# Patient Record
Sex: Male | Born: 1990
Health system: Southern US, Community
[De-identification: ages and names within clinical notes are randomized; demographics above are authoritative.]

## PROBLEM LIST (undated history)

## (undated) DIAGNOSIS — I10 Essential (primary) hypertension: Secondary | ICD-10-CM

## (undated) DIAGNOSIS — E079 Disorder of thyroid, unspecified: Secondary | ICD-10-CM

## (undated) HISTORY — DX: Disorder of thyroid, unspecified: E07.9

## (undated) HISTORY — DX: Essential (primary) hypertension: I10

---

## 2016-11-04 ENCOUNTER — Ambulatory Visit (HOSPITAL_COMMUNITY)
Admission: RE | Admit: 2016-11-04 | Discharge: 2016-11-04 | Disposition: A | Discharge status: Home / Self Care | Payer: BLUE CROSS/BLUE SHIELD | Source: Ambulatory Visit | Attending: Vascular Surgery | Admitting: Vascular Surgery

## 2016-11-04 ENCOUNTER — Other Ambulatory Visit: Payer: Self-pay | Admitting: Cardiology

## 2016-11-04 DIAGNOSIS — I10 Essential (primary) hypertension: Secondary | ICD-10-CM

## 2017-01-12 ENCOUNTER — Other Ambulatory Visit (HOSPITAL_COMMUNITY): Payer: Self-pay | Admitting: Gastroenterology

## 2017-01-12 DIAGNOSIS — R7989 Other specified abnormal findings of blood chemistry: Secondary | ICD-10-CM

## 2017-01-12 DIAGNOSIS — R945 Abnormal results of liver function studies: Secondary | ICD-10-CM

## 2017-01-19 ENCOUNTER — Ambulatory Visit (HOSPITAL_COMMUNITY)
Admission: RE | Admit: 2017-01-19 | Discharge: 2017-01-19 | Disposition: A | Discharge status: Home / Self Care | Payer: BLUE CROSS/BLUE SHIELD | Source: Ambulatory Visit | Attending: Gastroenterology | Admitting: Gastroenterology

## 2017-01-19 DIAGNOSIS — R945 Abnormal results of liver function studies: Secondary | ICD-10-CM | POA: Diagnosis present

## 2017-01-19 DIAGNOSIS — K760 Fatty (change of) liver, not elsewhere classified: Secondary | ICD-10-CM | POA: Insufficient documentation

## 2017-01-19 DIAGNOSIS — R7989 Other specified abnormal findings of blood chemistry: Secondary | ICD-10-CM

## 2017-10-01 DIAGNOSIS — I10 Essential (primary) hypertension: Secondary | ICD-10-CM | POA: Diagnosis not present

## 2017-10-01 DIAGNOSIS — E039 Hypothyroidism, unspecified: Secondary | ICD-10-CM | POA: Diagnosis not present

## 2018-01-01 DIAGNOSIS — E039 Hypothyroidism, unspecified: Secondary | ICD-10-CM | POA: Diagnosis not present

## 2018-01-26 ENCOUNTER — Other Ambulatory Visit (HOSPITAL_COMMUNITY): Payer: Self-pay | Admitting: Gastroenterology

## 2018-01-26 DIAGNOSIS — K7400 Hepatic fibrosis, unspecified: Secondary | ICD-10-CM

## 2018-01-26 DIAGNOSIS — K760 Fatty (change of) liver, not elsewhere classified: Secondary | ICD-10-CM

## 2018-03-31 DIAGNOSIS — Z6835 Body mass index (BMI) 35.0-35.9, adult: Secondary | ICD-10-CM | POA: Diagnosis not present

## 2018-03-31 DIAGNOSIS — Z23 Encounter for immunization: Secondary | ICD-10-CM | POA: Diagnosis not present

## 2018-03-31 DIAGNOSIS — E039 Hypothyroidism, unspecified: Secondary | ICD-10-CM | POA: Diagnosis not present

## 2018-03-31 DIAGNOSIS — I10 Essential (primary) hypertension: Secondary | ICD-10-CM | POA: Diagnosis not present

## 2018-09-25 IMAGING — US US ABDOMEN COMPLETE W/ ELASTOGRAPHY
1 series · 13 of 13 positions shown · non-contrast
Comparison: None.

CLINICAL DATA: Abnormal liver function tests



[Series 1: us abdomen complete w/ elastography · 0.20mm/px · 13 of 13 slices shown]
[im 1/13]
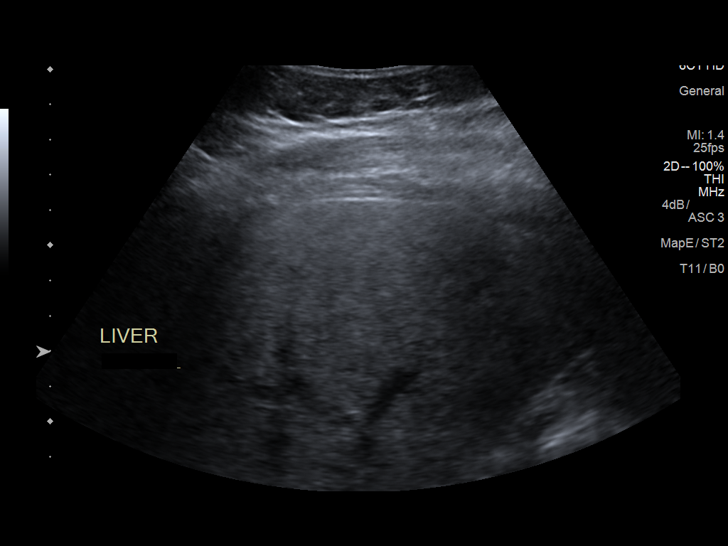
[im 2/13]
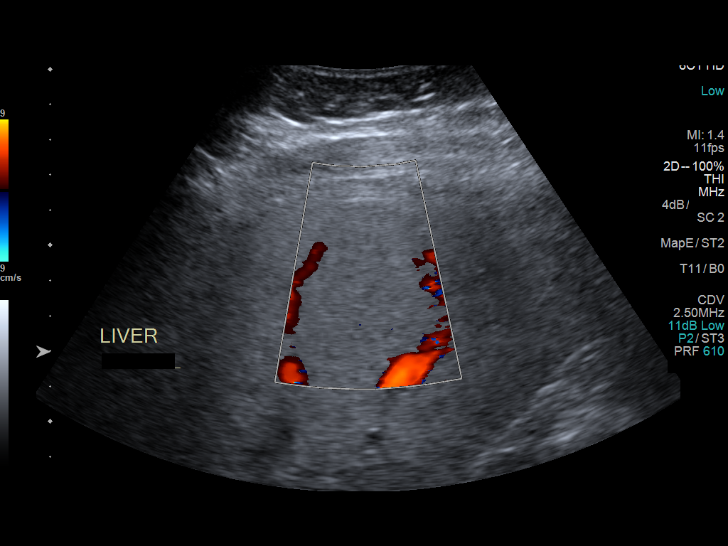
[im 3/13]
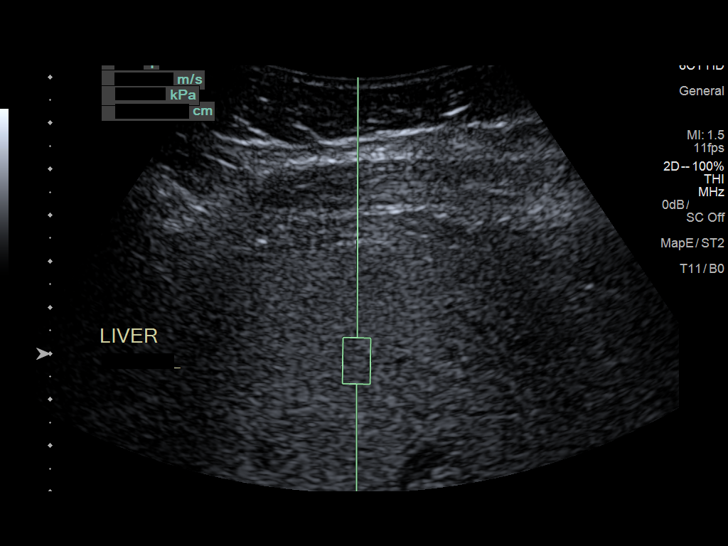
[im 4/13]
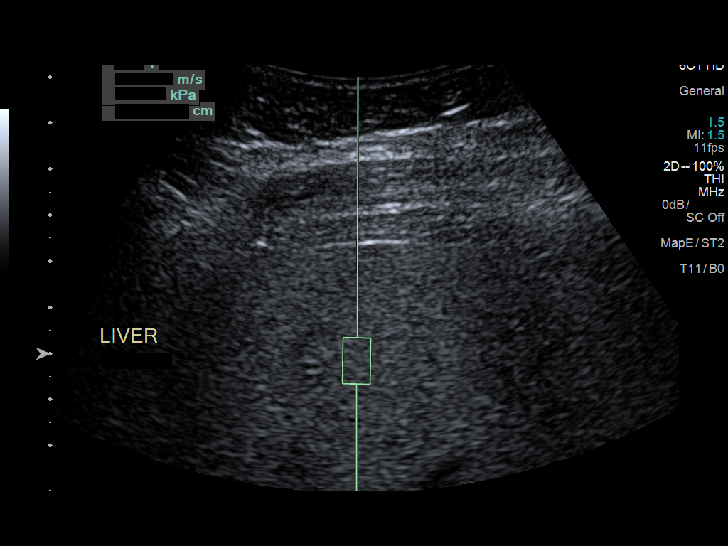
[im 5/13]
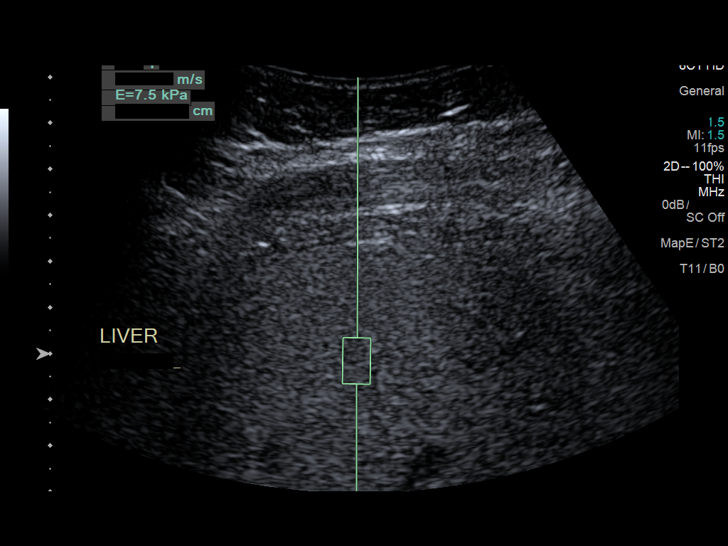
[im 6/13]
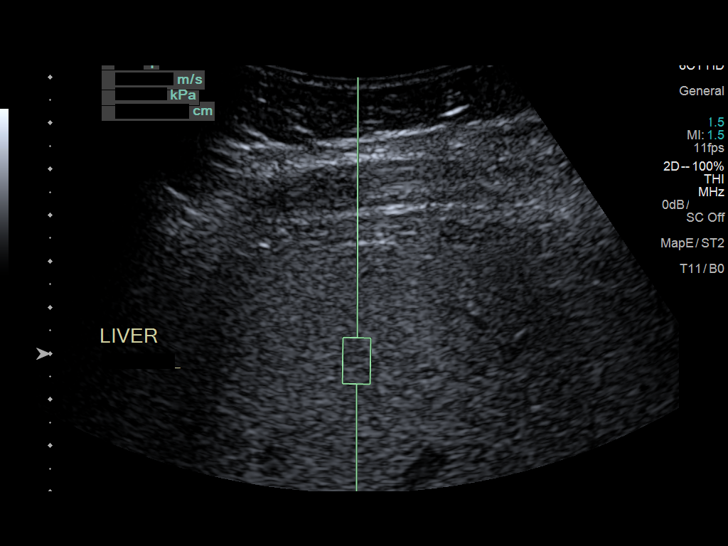
[im 7/13]
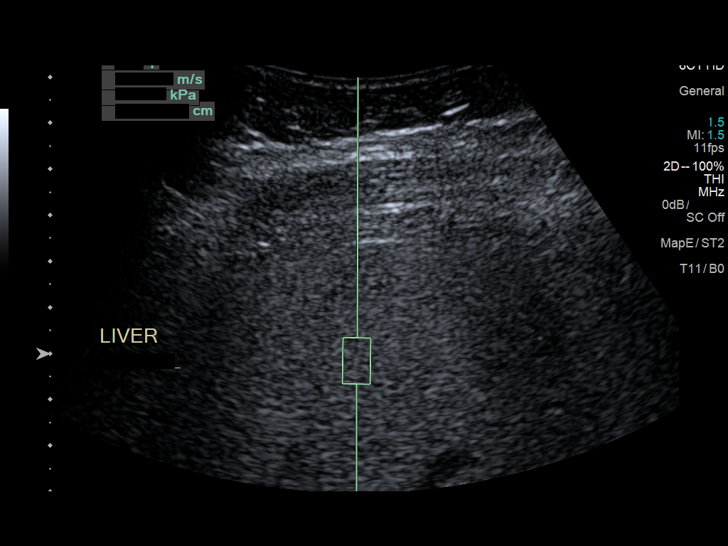
[im 8/13]
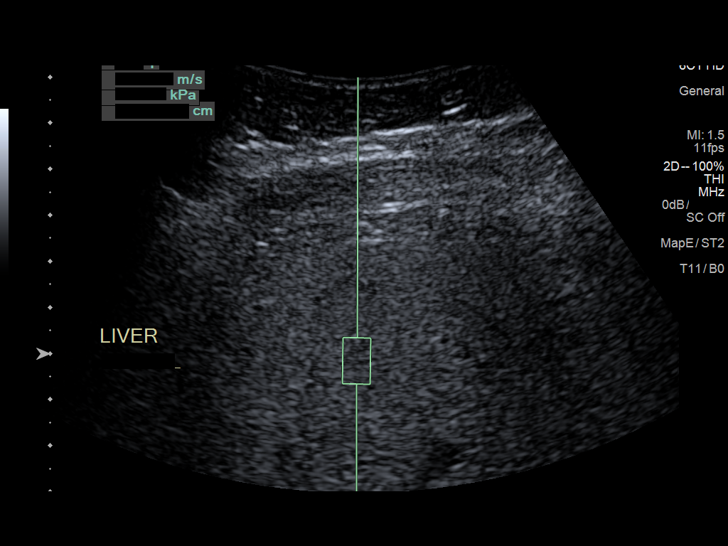
[im 9/13]
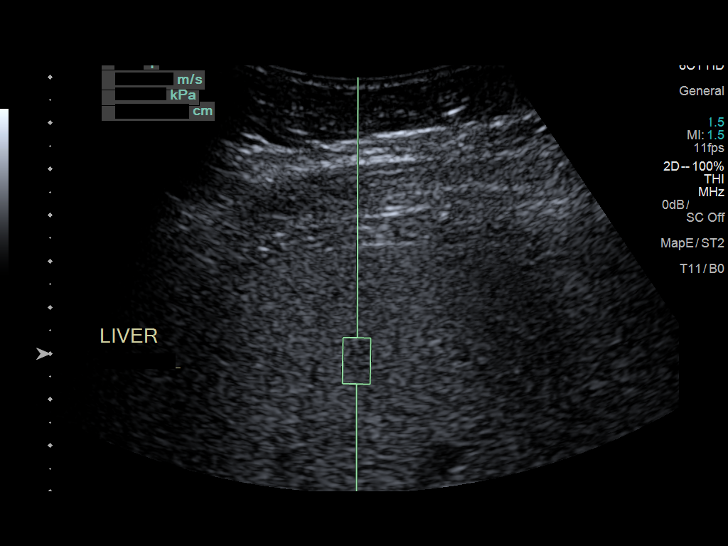
[im 10/13]
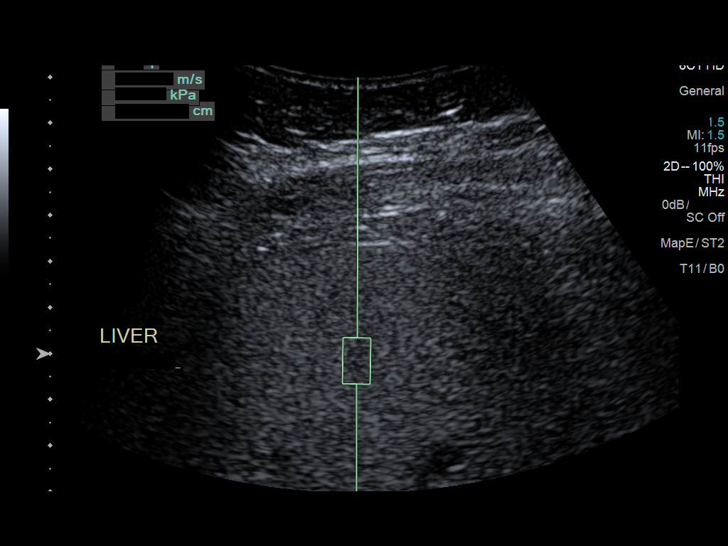
[im 11/13]
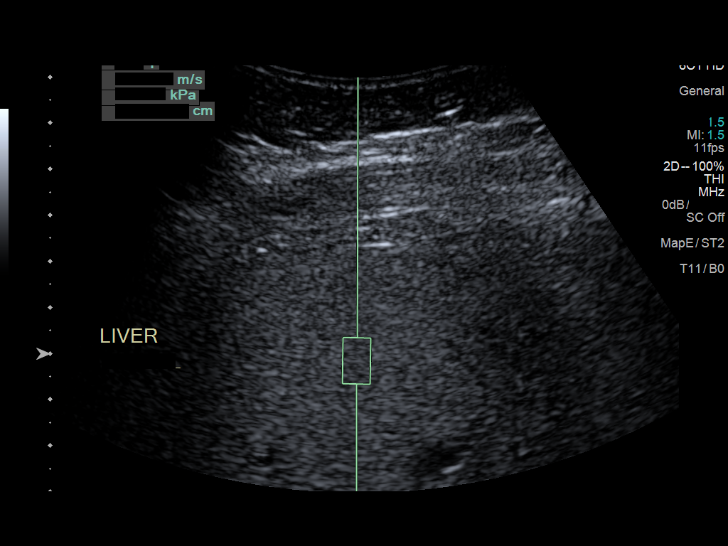
[im 12/13]
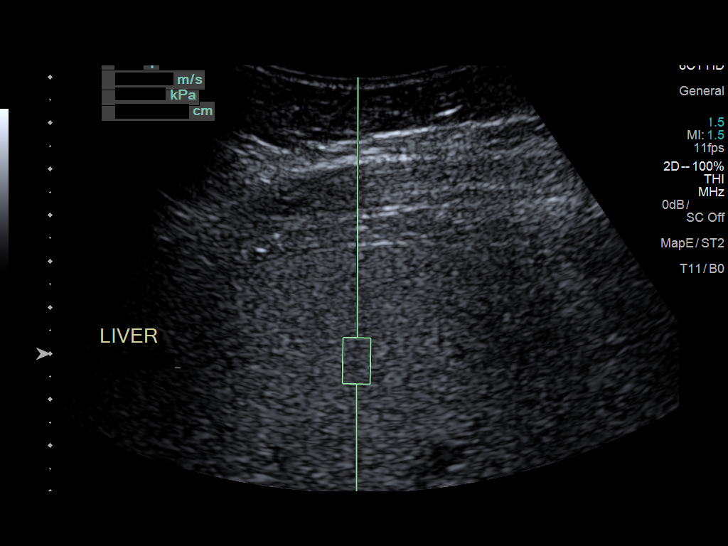
[im 13/13]
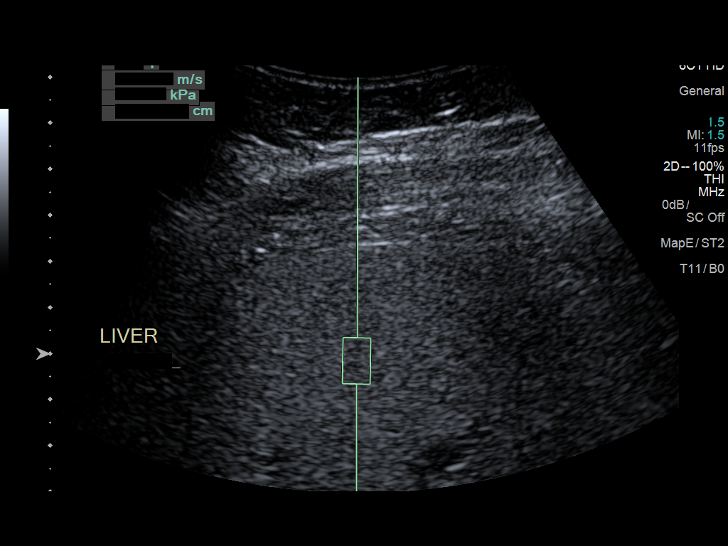

[13 of 13 positions shown; findings below may reference images not displayed]

FINDINGS: ULTRASOUND ABDOMEN

Gallbladder: No gallstones or wall thickening visualized. No
sonographic Murphy sign noted by sonographer.

Common bile duct: Diameter: 4 mm

Liver: Liver parenchyma is diffusely moderately into markedly
echogenic with prominent posterior acoustic attenuation, compatible
with diffuse hepatic steatosis. No definite liver surface
irregularity. No liver mass detected, noting decreased sensitivity
in the setting of an echogenic liver. Portal vein is patent on color
Doppler imaging with normal direction of blood flow towards the
liver.

IVC: No abnormality visualized.

Pancreas: Visualized portion unremarkable.

Spleen: Size and appearance within normal limits.

Right Kidney: Length: 13.5 cm. Echogenicity within normal limits. No
mass or hydronephrosis visualized.

Left Kidney: Length: 13.2 cm. Echogenicity within normal limits. No
mass or hydronephrosis visualized.

Abdominal aorta: No aneurysm visualized.

Other findings: None.

ULTRASOUND HEPATIC ELASTOGRAPHY

Device: Siemens Helix VTQ

Patient position: Left Lateral Decubitus

Transducer 6C1

Number of measurements: 10

Hepatic segment:  8

Median velocity:   2.57  m/sec

IQR:

IQR/Median velocity ratio:

Corresponding Metavir fibrosis score:  Some F3 + F4

Risk of fibrosis: High

Limitations of exam: None

Pertinent findings noted on other imaging exams:  None

Please note that abnormal shear wave velocities may also be
identified in clinical settings other than with hepatic fibrosis,
such as: acute hepatitis, elevated right heart and central venous
pressures including use of beta blockers, Jules disease
(Limone), infiltrative processes such as
mastocytosis/amyloidosis/infiltrative tumor, extrahepatic
cholestasis, in the post-prandial state, and liver transplantation.
Correlation with patient history, laboratory data, and clinical
condition recommended.
IMPRESSION: ULTRASOUND ABDOMEN:

1. Diffuse hepatic steatosis. No overt morphologic changes of
cirrhosis.
2. Otherwise normal abdominal sonogram.

ULTRASOUND HEPATIC ELASTOGRAPHY:

Median hepatic shear wave velocity is calculated at 2.57 m/sec.

Corresponding Metavir fibrosis score is  Some F3 + F4.

Risk of fibrosis is High.

Follow-up: Follow up advised.

## 2018-09-25 IMAGING — US US ABDOMEN COMPLETE W/ ELASTOGRAPHY
1 series · 13 of 25 positions shown · non-contrast
Comparison: None.

CLINICAL DATA: Abnormal liver function tests



[Series 1: us abdomen complete w/ elastography · 0.25mm/px · 13 of 73 slices shown]
[im 1/73]
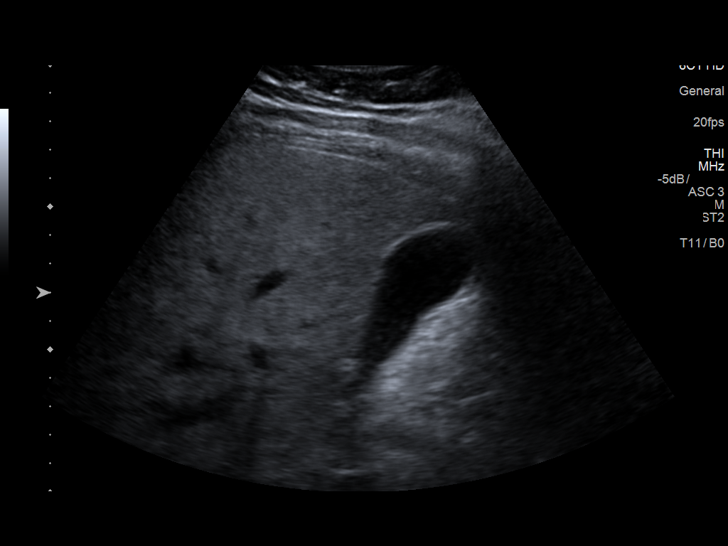
[im 7/73]
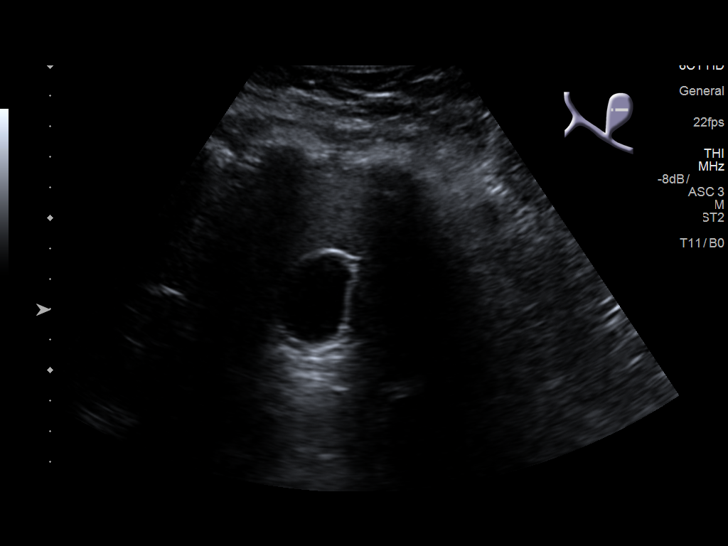
[im 13/73]
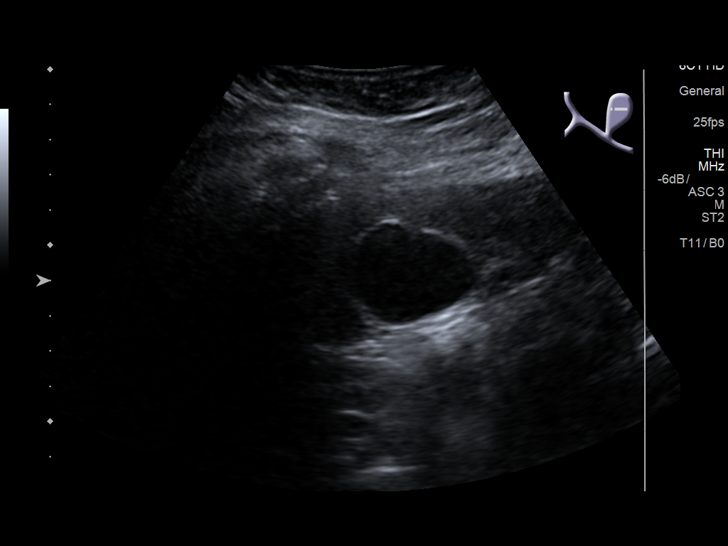
[im 19/73]
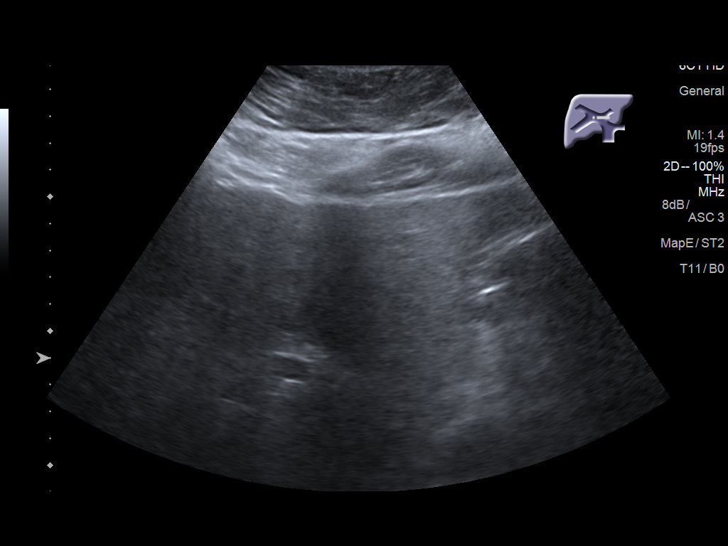
[im 25/73]
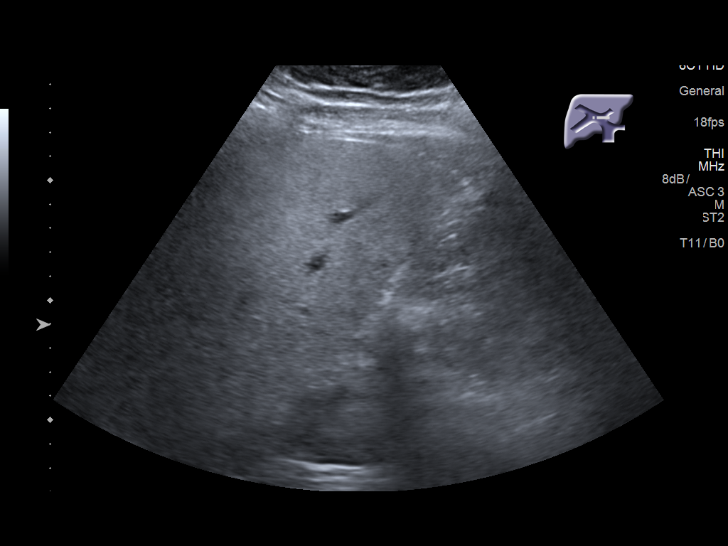
[im 31/73]
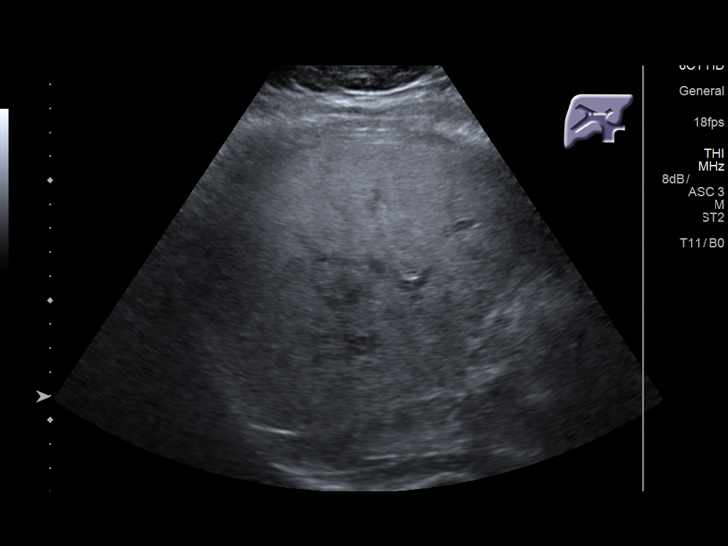
[im 37/73]
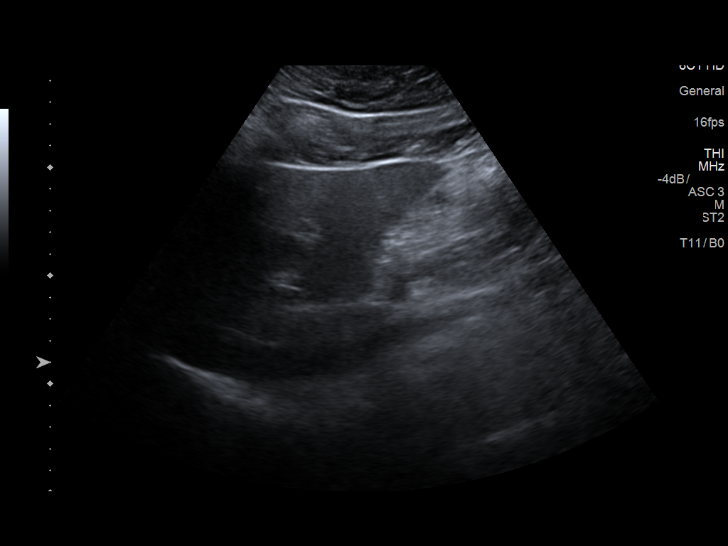
[im 43/73]
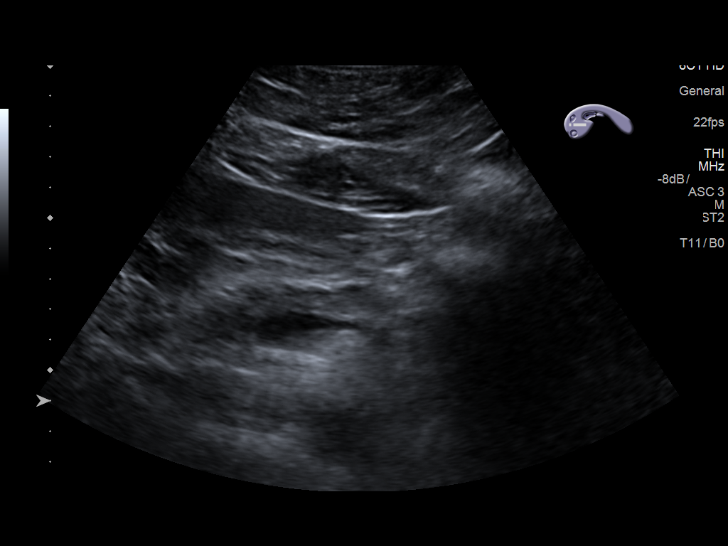
[im 49/73]
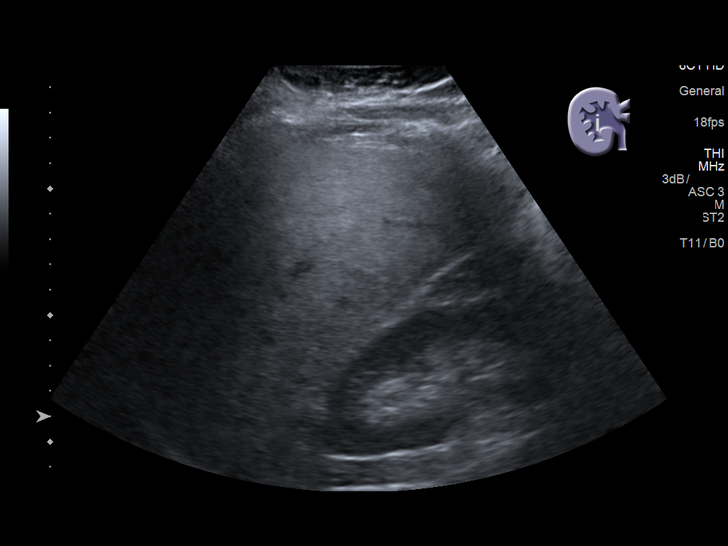
[im 55/73]
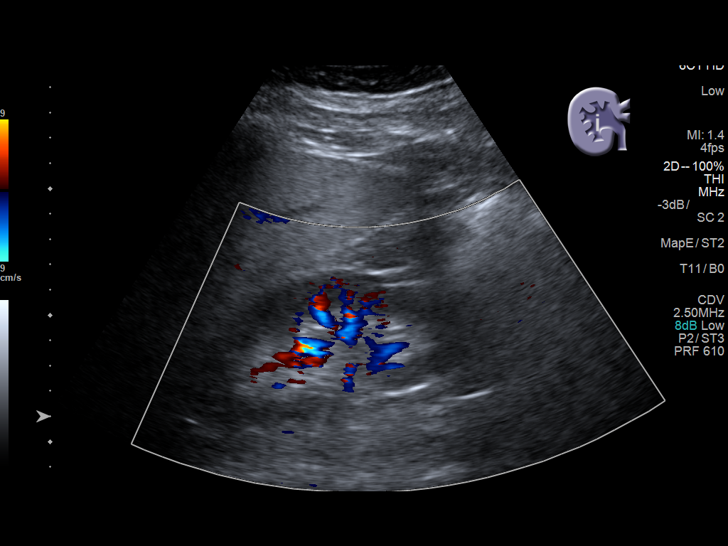
[im 61/73]
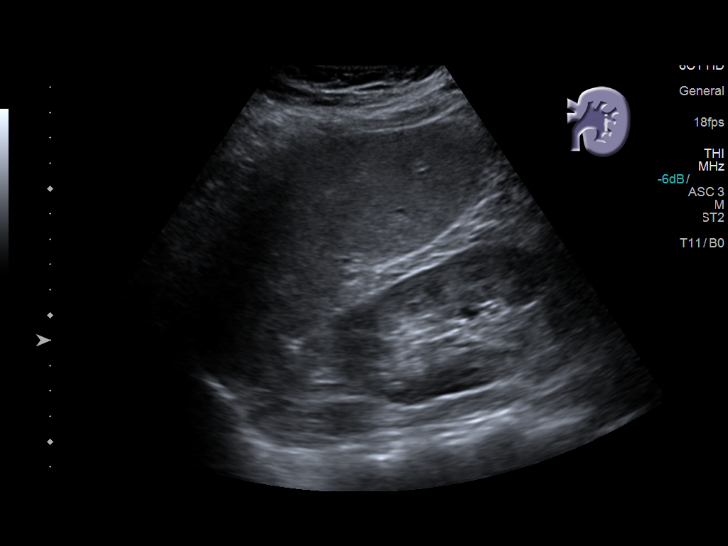
[im 67/73]
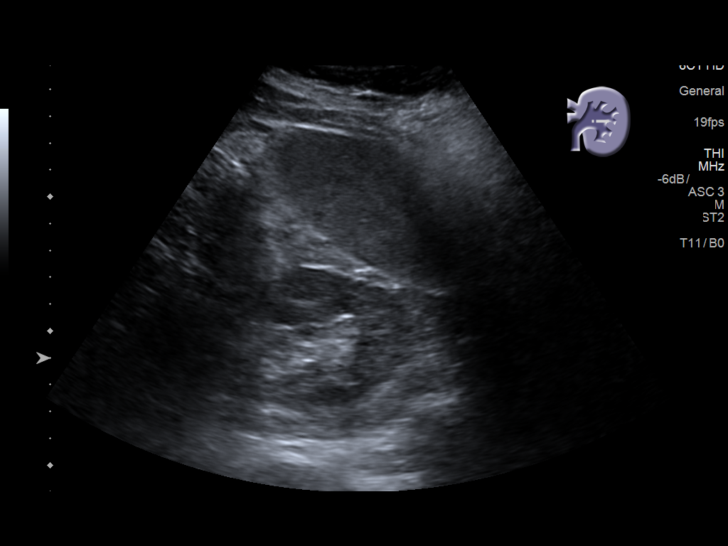
[im 73/73]
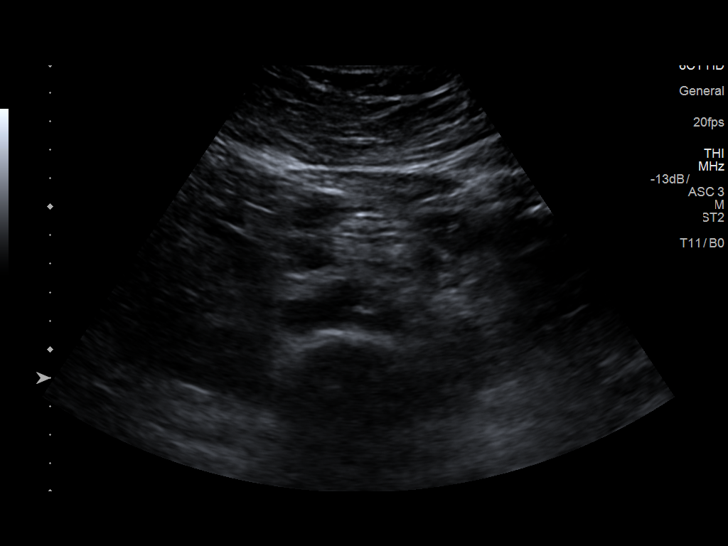

[13 of 25 positions shown; findings below may reference images not displayed]

FINDINGS: ULTRASOUND ABDOMEN

Gallbladder: No gallstones or wall thickening visualized. No
sonographic Murphy sign noted by sonographer.

Common bile duct: Diameter: 4 mm

Liver: Liver parenchyma is diffusely moderately into markedly
echogenic with prominent posterior acoustic attenuation, compatible
with diffuse hepatic steatosis. No definite liver surface
irregularity. No liver mass detected, noting decreased sensitivity
in the setting of an echogenic liver. Portal vein is patent on color
Doppler imaging with normal direction of blood flow towards the
liver.

IVC: No abnormality visualized.

Pancreas: Visualized portion unremarkable.

Spleen: Size and appearance within normal limits.

Right Kidney: Length: 13.5 cm. Echogenicity within normal limits. No
mass or hydronephrosis visualized.

Left Kidney: Length: 13.2 cm. Echogenicity within normal limits. No
mass or hydronephrosis visualized.

Abdominal aorta: No aneurysm visualized.

Other findings: None.

ULTRASOUND HEPATIC ELASTOGRAPHY

Device: Siemens Helix VTQ

Patient position: Left Lateral Decubitus

Transducer 6C1

Number of measurements: 10

Hepatic segment:  8

Median velocity:   2.57  m/sec

IQR:

IQR/Median velocity ratio:

Corresponding Metavir fibrosis score:  Some F3 + F4

Risk of fibrosis: High

Limitations of exam: None

Pertinent findings noted on other imaging exams:  None

Please note that abnormal shear wave velocities may also be
identified in clinical settings other than with hepatic fibrosis,
such as: acute hepatitis, elevated right heart and central venous
pressures including use of beta blockers, Jules disease
(Limone), infiltrative processes such as
mastocytosis/amyloidosis/infiltrative tumor, extrahepatic
cholestasis, in the post-prandial state, and liver transplantation.
Correlation with patient history, laboratory data, and clinical
condition recommended.
IMPRESSION: ULTRASOUND ABDOMEN:

1. Diffuse hepatic steatosis. No overt morphologic changes of
cirrhosis.
2. Otherwise normal abdominal sonogram.

ULTRASOUND HEPATIC ELASTOGRAPHY:

Median hepatic shear wave velocity is calculated at 2.57 m/sec.

Corresponding Metavir fibrosis score is  Some F3 + F4.

Risk of fibrosis is High.

Follow-up: Follow up advised.

## 2018-12-18 ENCOUNTER — Encounter: Payer: Self-pay | Admitting: Emergency Medicine

## 2018-12-18 ENCOUNTER — Other Ambulatory Visit: Payer: Self-pay

## 2018-12-18 ENCOUNTER — Emergency Department
Admission: EM | Admit: 2018-12-18 | Discharge: 2018-12-18 | Disposition: A | Discharge status: Home / Self Care | Payer: BLUE CROSS/BLUE SHIELD | Source: Home / Self Care

## 2018-12-18 DIAGNOSIS — T63441A Toxic effect of venom of bees, accidental (unintentional), initial encounter: Secondary | ICD-10-CM | POA: Diagnosis not present

## 2018-12-18 DIAGNOSIS — R112 Nausea with vomiting, unspecified: Secondary | ICD-10-CM

## 2018-12-18 MED ORDER — PREDNISONE 50 MG PO TABS: 50.0000 mg | ORAL_TABLET | Freq: Every day | ORAL | 0 refills | Status: AC

## 2018-12-18 MED ORDER — CETIRIZINE HCL 10 MG PO TABS: 10.0000 mg | ORAL_TABLET | Freq: Every day | ORAL | 0 refills | Status: DC

## 2018-12-18 MED ORDER — DEXAMETHASONE SODIUM PHOSPHATE 10 MG/ML IJ SOLN
10.0000 mg | Freq: Once | INTRAMUSCULAR | Status: AC
  Administered 2018-12-18: 10 mg via INTRAMUSCULAR

## 2018-12-18 MED ORDER — DIPHENHYDRAMINE HCL 50 MG/ML IJ SOLN
25.0000 mg | Freq: Once | INTRAMUSCULAR | Status: AC
  Administered 2018-12-18: 25 mg via INTRAMUSCULAR

## 2018-12-18 MED ORDER — FAMOTIDINE 40 MG PO TABS
40.0000 mg | ORAL_TABLET | Freq: Once | ORAL | Status: AC
  Administered 2018-12-18: 40 mg via ORAL

## 2018-12-18 MED ORDER — ONDANSETRON 4 MG PO TBDP: 4.0000 mg | ORAL_TABLET | Freq: Three times a day (TID) | ORAL | 0 refills | Status: DC | PRN

## 2018-12-18 MED ORDER — EPINEPHRINE 0.3 MG/0.3ML IJ SOAJ: 0.3000 mg | INTRAMUSCULAR | 1 refills | Status: DC | PRN

## 2018-12-18 NOTE — ED Provider Notes (Signed)
[facility]    CSN: [id number] Arrival date & time: 12/18/18  1242      History   Chief Complaint Chief Complaint  Patient presents with  . Insect Bite    multiple yellow jacket stings    HPI Cole Baker is a 28 y.o. male.   HPI Cole OysterKelly Baker is a 28 y.o. male presenting to UC with reports of multiple yellow jacket stings to his back, arm and abdomen about 1 hour PTA. Pt was clearing out an area of his yard at a house he recently moved into with his wife of 2 weeks.  Some of the yellow jackets flew up into his shirt. He did have 1 episode of vomiting in parking lot of urgent care. Pt denies nausea at this time, he is unsure if that was due to adrenaline from the incident that just occurred. Pt denies oral swelling or hives. Denies SOB but is concerned for possible severe allergic reaction because his father is severely allergic.  Pt has never been stung before.  Pain at location of stings is mildly burning.  No medication taken PTA.    History reviewed. No pertinent past medical history.  There are no active problems to display for this patient.   History reviewed. No pertinent surgical history.     Home Medications    Prior to Admission medications   Medication Sig Start Date End Date Taking? Authorizing Provider  levothyroxine (SYNTHROID) 150 MCG tablet Take 150 mcg by mouth daily before breakfast.   Yes [provider]  lisinopril (ZESTRIL) 20 MG tablet Take 20 mg by mouth daily.   Yes [provider]  cetirizine (ZYRTEC) 10 MG tablet Take 1 tablet (10 mg total) by mouth daily. 12/18/18   Lurene ShadowPhelps, Coby Antrobus O, PA-C  EPINEPHrine (EPIPEN 2-PAK) 0.3 mg/0.3 mL IJ SOAJ injection Inject 0.3 mLs (0.3 mg total) into the muscle as needed for anaphylaxis. 12/18/18   Lurene ShadowPhelps, Atiyana Welte O, PA-C  ondansetron (ZOFRAN ODT) 4 MG disintegrating tablet Take 1 tablet (4 mg total) by mouth every 8 (eight) hours as needed. 12/18/18   Lurene ShadowPhelps, Darshawn Boateng O, PA-C  predniSONE (DELTASONE) 50 MG  tablet Take 1 tablet (50 mg total) by mouth daily with breakfast for 5 days. 12/18/18 12/23/18  Lurene ShadowPhelps, Thayden Lemire O, PA-C    Family History No family history on file.  Social History Social History   Tobacco Use  . Smoking status: Never Smoker  . Smokeless tobacco: Never Used  Substance Use Topics  . Alcohol use: Not on file  . Drug use: Not on file     Allergies   Cefzil [cefprozil]   Review of Systems Review of Systems  Constitutional: Negative for chills and fever.  HENT: Negative for facial swelling, sore throat, trouble swallowing and voice change.   Respiratory: Negative for cough, chest tightness and shortness of breath.   Cardiovascular: Negative for chest pain, palpitations and leg swelling.  Gastrointestinal: Positive for nausea and vomiting. Negative for abdominal pain and diarrhea.  Musculoskeletal: Negative for arthralgias, back pain and myalgias.  Skin: Positive for color change ( at location of stings). Negative for rash.     Physical Exam Triage Vital Signs ED Triage Vitals [12/18/18 1315]  Enc Vitals Group     BP 122/85     Pulse Rate 82     Resp      Temp 98.4 F (36.9 C)     Temp Source Oral     SpO2 98 %  Weight 250 lb (113.4 kg)     Height 6' (1.829 m)     Head Circumference      Peak Flow      Pain Score 0     Pain Loc      Pain Edu?      Excl. in GC?    No data found.  Updated Vital Signs BP 119/82 (BP Location: Left Arm)   Pulse 68   Temp 98.4 F (36.9 C) (Oral)   Ht 6' (1.829 m)   Wt 250 lb (113.4 kg)   SpO2 98% Comment: vital recheck per provider  BMI 33.91 kg/m   Visual Acuity Right Eye Distance:   Left Eye Distance:   Bilateral Distance:    Right Eye Near:   Left Eye Near:    Bilateral Near:     Physical Exam Vitals signs and nursing note reviewed.  Constitutional:      Appearance: Normal appearance. He is well-developed.  HENT:     Head: Normocephalic and atraumatic.     Nose: Nose normal.     Mouth/Throat:      Lips: Pink.     Mouth: Mucous membranes are moist.     Pharynx: Oropharynx is clear. Uvula midline. No pharyngeal swelling or uvula swelling.  Neck:     Musculoskeletal: Normal range of motion.  Cardiovascular:     Rate and Rhythm: Normal rate and regular rhythm.  Pulmonary:     Effort: Pulmonary effort is normal. No respiratory distress.     Breath sounds: Normal breath sounds. No stridor. No wheezing, rhonchi or rales.  Musculoskeletal: Normal range of motion.  Skin:    General: Skin is warm and dry.     Findings: Erythema present. No rash.     Comments: 3 erythematous welts on back, mildly tender, c/w location of stings.  1 erythematous 1cm welt on Right under arm and lower abdomen c/w sting location.  No foreign bodies/stinges visualized on exam. No urticaria.    Neurological:     Mental Status: He is alert and oriented to person, place, and time.  Psychiatric:        Behavior: Behavior normal.      UC Treatments / Results  Labs (all labs ordered are listed, but only abnormal results are displayed) Labs Reviewed - No data to display  EKG   Radiology No results found.  Procedures Procedures (including critical care time)  Medications Ordered in UC Medications  famotidine (PEPCID) tablet 40 mg (40 mg Oral Given 12/18/18 1311)  diphenhydrAMINE (BENADRYL) injection 25 mg (25 mg Intramuscular Given 12/18/18 1311)  dexamethasone (DECADRON) injection 10 mg (10 mg Intramuscular Given 12/18/18 1312)    Initial Impression / Assessment and Plan / UC Course  I have reviewed the triage vital signs and the nursing notes.  Pertinent labs & imaging results that were available during my care of the patient were reviewed by me and considered in my medical decision making (see chart for details).     Due to reports of father's allergic hx, multiple stings and one episode of vomiting just PTA, will start treatment for severe allergic reaction. Pt given pepcid, benadryl, and decadron  in UC. Pt monitored in UC for over 1 hour w/o worsening of symptoms. Vitals normal. No longer concerned for anaphylaxis at this time. Pt safe for discharge home. Discussed symptoms that warrant emergent care in the ED. AVS provided.  Final Clinical Impressions(s) / UC Diagnoses   Final diagnoses:  Bee  sting reaction, accidental or unintentional, initial encounter  Nausea and vomiting in adult     Discharge Instructions      Because of your father's history as well as an episode of vomiting today, there is high concern for a worse allergic reaction the next time you are stung. Because of this, you have been prescribed an EpiPen.  You may discuss further with your family doctor next week if you are hesitant to fill based on any cost concerns.  If you develop throat, tongue, or lip swelling, hives (itch red rash all over) throat itching/or tightness, more vomiting or other new concerning symptoms develop, please seek medical attention immediately as it may be a delayed reaction to the multiple stings.  You may take Tylenol and Motrin as needed for pain and swelling, use cool compresses and apply over the counter hydrocortisone cream for any itching you may have.  You were given a shot of steroids in urgent care along with benadryl. This will help with inflammation. You may take more benadryl this evening or up to every 4-6 hours for itching but it can cause drowsiness so use caution while driving.   You may start the prednisone pills at breakfast if you have continued inflammation or itching at areas of the stings.     ED Prescriptions    Medication Sig Dispense Auth. Provider   predniSONE (DELTASONE) 50 MG tablet Take 1 tablet (50 mg total) by mouth daily with breakfast for 5 days. 5 tablet Gerarda Fraction, Petros Ahart O, PA-C   ondansetron (ZOFRAN ODT) 4 MG disintegrating tablet Take 1 tablet (4 mg total) by mouth every 8 (eight) hours as needed. 8 tablet Gerarda Fraction, Kalyna Paolella O, PA-C   cetirizine (ZYRTEC) 10  MG tablet Take 1 tablet (10 mg total) by mouth daily. 30 tablet Noe Gens, PA-C   EPINEPHrine (EPIPEN 2-PAK) 0.3 mg/0.3 mL IJ SOAJ injection Inject 0.3 mLs (0.3 mg total) into the muscle as needed for anaphylaxis. 1 each Noe Gens, PA-C     Controlled Substance Prescriptions [place] Controlled Substance Registry consulted? Not Applicable   Tyrell Antonio 12/18/18 1517

## 2018-12-18 NOTE — ED Triage Notes (Signed)
Patient was stung by yellow jackets today while doing yard work.  Dad has a severe history of allergic reactions to yellow jackets.

## 2018-12-18 NOTE — Discharge Instructions (Signed)
°  Because of your father's history as well as an episode of vomiting today, there is high concern for a worse allergic reaction the next time you are stung. Because of this, you have been prescribed an EpiPen.  You may discuss further with your family doctor next week if you are hesitant to fill based on any cost concerns.  If you develop throat, tongue, or lip swelling, hives (itch red rash all over) throat itching/or tightness, more vomiting or other new concerning symptoms develop, please seek medical attention immediately as it may be a delayed reaction to the multiple stings.  You may take Tylenol and Motrin as needed for pain and swelling, use cool compresses and apply over the counter hydrocortisone cream for any itching you may have.  You were given a shot of steroids in urgent care along with benadryl. This will help with inflammation. You may take more benadryl this evening or up to every 4-6 hours for itching but it can cause drowsiness so use caution while driving.   You may start the prednisone pills at breakfast if you have continued inflammation or itching at areas of the stings.

## 2021-02-21 ENCOUNTER — Encounter: Payer: Self-pay | Admitting: Family Medicine

## 2021-02-21 ENCOUNTER — Ambulatory Visit (INDEPENDENT_AMBULATORY_CARE_PROVIDER_SITE_OTHER): Payer: 59 | Admitting: Family Medicine

## 2021-02-21 ENCOUNTER — Other Ambulatory Visit: Payer: Self-pay

## 2021-02-21 VITALS — BP 174/96 | HR 73 | Temp 97.9°F | Ht 71.5 in | Wt 268.3 lb

## 2021-02-21 DIAGNOSIS — E039 Hypothyroidism, unspecified: Secondary | ICD-10-CM | POA: Insufficient documentation

## 2021-02-21 DIAGNOSIS — I10 Essential (primary) hypertension: Secondary | ICD-10-CM | POA: Diagnosis not present

## 2021-02-21 DIAGNOSIS — Z23 Encounter for immunization: Secondary | ICD-10-CM

## 2021-02-21 MED ORDER — LISINOPRIL 20 MG PO TABS: 20.0000 mg | ORAL_TABLET | Freq: Every day | ORAL | 1 refills | Status: DC

## 2021-02-21 NOTE — Assessment & Plan Note (Signed)
Blood pressure elevated today.  Restarting lisinopril 20 mg.  Follow-up in 4 weeks for nurse visit to recheck blood pressure.  Continue titration of lisinopril if not well controlled.  Update labs today.

## 2021-02-21 NOTE — Assessment & Plan Note (Signed)
Update TSH and free T4 levels today.

## 2021-02-21 NOTE — Patient Instructions (Addendum)
Great to meet you today! We'll be in touch with lab results.  Follow up in 4-5 weeks for blood pressure recheck.

## 2021-02-21 NOTE — Progress Notes (Signed)
Cole Baker - 30 y.o. male MRN [id number]  Date of birth: 02-05-1991  Subjective Chief Complaint  Patient presents with   Establish Care    HPI Cole Baker is a 30 year old male here today for initial visit to establish care.  He has history of hypertension and hypothyroidism.  He has been off medication for both of these for a couple years.  His blood pressure is elevated today.  He was previously treated with lisinopril 20 mg daily.  He tolerated this well previously.  He recalls that his blood pressure was well controlled with this.  He was on levothyroxine 150 mcg previously.  He has not noted any difference with being off of this.  ROS:  A comprehensive ROS was completed and negative except as noted per HPI  Allergies  Allergen Reactions   Cefzil [Cefprozil] Rash    Past Medical History:  Diagnosis Date   Hypertension    Thyroid disease     History reviewed. No pertinent surgical history.  Social History   Socioeconomic History   Marital status: Married    Spouse name: Not on file   Number of children: Not on file   Years of education: Not on file   Highest education level: Not on file  Occupational History   Occupation: Production designer, theatre/television/film  Tobacco Use   Smoking status: Some Days    Types: Cigars    Passive exposure: Never   Smokeless tobacco: Never  Vaping Use   Vaping Use: Never used  Substance and Sexual Activity   Alcohol use: Yes    Alcohol/week: 1.0 - 2.0 standard drink    Types: 1 - 2 Standard drinks or equivalent per week   Drug use: Yes    Frequency: 0.2 times per week    Comment: CBD   Sexual activity: Yes  Other Topics Concern   Not on file  Social History Narrative   Not on file   Social Determinants of Health   Financial Resource Strain: Not on file  Food Insecurity: Not on file  Transportation Needs: Not on file  Physical Activity: Not on file  Stress: Not on file  Social Connections: Not on file    Family History  Problem Relation Age of Onset    Stroke Mother    Diabetes Father     Health Maintenance  Topic Date Due   COVID-19 Vaccine (1) Never done   HIV Screening  Never done   Hepatitis C Screening  Never done   TETANUS/TDAP  Never done   INFLUENZA VACCINE  Completed   Pneumococcal Vaccine 71-10 Years old  Aged Out   HPV VACCINES  Aged Out     ----------------------------------------------------------------------------------------------------------------------------------------------------------------------------------------------------------------- Physical Exam BP (!) 174/96 (BP Location: Left Arm, Patient Position: Sitting, Cuff Size: Large)   Pulse 73   Temp 97.9 F (36.6 C)   Ht 5' 11.5" (1.816 m)   Wt 268 lb 4.8 oz (121.7 kg)   SpO2 100%   BMI 36.90 kg/m   Physical Exam Constitutional:      Appearance: Normal appearance.  HENT:     Head: Normocephalic and atraumatic.  Eyes:     General: No scleral icterus. Cardiovascular:     Rate and Rhythm: Normal rate and regular rhythm.  Pulmonary:     Effort: Pulmonary effort is normal.     Breath sounds: Normal breath sounds.  Musculoskeletal:     Cervical back: Neck supple.  Skin:    General: Skin is warm and dry.  Neurological:  General: No focal deficit present.     Mental Status: He is alert.  Psychiatric:        Mood and Affect: Mood normal.        Behavior: Behavior normal.    ------------------------------------------------------------------------------------------------------------------------------------------------------------------------------------------------------------------- Assessment and Plan  Essential hypertension Blood pressure elevated today.  Restarting lisinopril 20 mg.  Follow-up in 4 weeks for nurse visit to recheck blood pressure.  Continue titration of lisinopril if not well controlled.  Update labs today.  Acquired hypothyroidism Update TSH and free T4 levels today.   Meds ordered this encounter  Medications    lisinopril (ZESTRIL) 20 MG tablet    Sig: Take 1 tablet (20 mg total) by mouth daily.    Dispense:  90 tablet    Refill:  1    Return in about 4 weeks (around 03/21/2021) for BP check with nurse.    This visit occurred during the SARS-CoV-2 public health emergency.  Safety protocols were in place, including screening questions prior to the visit, additional usage of staff PPE, and extensive cleaning of exam room while observing appropriate contact time as indicated for disinfecting solutions.

## 2021-02-22 ENCOUNTER — Other Ambulatory Visit: Payer: Self-pay | Admitting: Family Medicine

## 2021-02-22 LAB — CBC WITH DIFFERENTIAL/PLATELET
Absolute Monocytes: 493 cells/uL (ref 200–950)
Basophils Absolute: 39 cells/uL (ref 0–200)
Basophils Relative: 0.7 %
Eosinophils Absolute: 78 cells/uL (ref 15–500)
Eosinophils Relative: 1.4 %
HCT: 42.5 % (ref 38.5–50.0)
Hemoglobin: 14.7 g/dL (ref 13.2–17.1)
Lymphs Abs: 2212 cells/uL (ref 850–3900)
MCH: 31.8 pg (ref 27.0–33.0)
MCHC: 34.6 g/dL (ref 32.0–36.0)
MCV: 92 fL (ref 80.0–100.0)
MPV: 11.1 fL (ref 7.5–12.5)
Monocytes Relative: 8.8 %
Neutro Abs: 2778 cells/uL (ref 1500–7800)
Neutrophils Relative %: 49.6 %
Platelets: 275 10*3/uL (ref 140–400)
RBC: 4.62 10*6/uL (ref 4.20–5.80)
RDW: 12.3 % (ref 11.0–15.0)
Total Lymphocyte: 39.5 %
WBC: 5.6 10*3/uL (ref 3.8–10.8)

## 2021-02-22 LAB — COMPLETE METABOLIC PANEL WITH GFR
AG Ratio: 2.1 (calc) (ref 1.0–2.5)
ALT: 83 U/L — ABNORMAL HIGH (ref 9–46)
AST: 33 U/L (ref 10–40)
Albumin: 4.9 g/dL (ref 3.6–5.1)
Alkaline phosphatase (APISO): 39 U/L (ref 36–130)
BUN: 11 mg/dL (ref 7–25)
CO2: 30 mmol/L (ref 20–32)
Calcium: 9.7 mg/dL (ref 8.6–10.3)
Chloride: 103 mmol/L (ref 98–110)
Creat: 0.91 mg/dL (ref 0.60–1.26)
Globulin: 2.3 g/dL (calc) (ref 1.9–3.7)
Glucose, Bld: 78 mg/dL (ref 65–99)
Potassium: 3.9 mmol/L (ref 3.5–5.3)
Sodium: 139 mmol/L (ref 135–146)
Total Bilirubin: 1.1 mg/dL (ref 0.2–1.2)
Total Protein: 7.2 g/dL (ref 6.1–8.1)
eGFR: 116 mL/min/{1.73_m2} (ref >=60)

## 2021-02-22 LAB — T4, FREE: Free T4: 1 ng/dL (ref 0.8–1.8)

## 2021-02-22 LAB — TSH: TSH: 18.29 mIU/L — ABNORMAL HIGH (ref 0.40–4.50)

## 2021-02-22 MED ORDER — LEVOTHYROXINE SODIUM 75 MCG PO TABS: 75.0000 ug | ORAL_TABLET | Freq: Every day | ORAL | 0 refills | Status: DC

## 2021-03-21 ENCOUNTER — Ambulatory Visit (INDEPENDENT_AMBULATORY_CARE_PROVIDER_SITE_OTHER): Payer: 59 | Admitting: Family Medicine

## 2021-03-21 VITALS — BP 128/86 | HR 63

## 2021-03-21 DIAGNOSIS — I10 Essential (primary) hypertension: Secondary | ICD-10-CM

## 2021-03-21 NOTE — Progress Notes (Signed)
Established Patient Office Visit  Subjective:  Patient ID: Cole Baker, male    DOB: September 23, 1990  Age: 30 y.o. MRN: [id number]  CC:  Chief Complaint  Patient presents with   Blood Pressure Check    HPI Cole Baker presents for a BP check. Pt denies chest pain, SOB, dizziness, or heart palpitations. Taking meds as directed w/o problems.  Denies medication side effects. First BP is 131/82, after sitting for 10 minutes BP is 128/86.  Past Medical History:  Diagnosis Date   Hypertension    Thyroid disease     No past surgical history on file.  Family History  Problem Relation Age of Onset   Stroke Mother    Diabetes Father     Social History   Socioeconomic History   Marital status: Married    Spouse name: Not on file   Number of children: Not on file   Years of education: Not on file   Highest education level: Not on file  Occupational History   Occupation: Production designer, theatre/television/film  Tobacco Use   Smoking status: Some Days    Types: Cigars    Passive exposure: Never   Smokeless tobacco: Never  Vaping Use   Vaping Use: Never used  Substance and Sexual Activity   Alcohol use: Yes    Alcohol/week: 1.0 - 2.0 standard drink    Types: 1 - 2 Standard drinks or equivalent per week   Drug use: Yes    Frequency: 0.2 times per week    Comment: CBD   Sexual activity: Yes  Other Topics Concern   Not on file  Social History Narrative   Not on file   Social Determinants of Health   Financial Resource Strain: Not on file  Food Insecurity: Not on file  Transportation Needs: Not on file  Physical Activity: Not on file  Stress: Not on file  Social Connections: Not on file  Intimate Partner Violence: Not on file    Outpatient Medications Prior to Visit  Medication Sig Dispense Refill   levothyroxine (SYNTHROID) 75 MCG tablet Take 1 tablet (75 mcg total) by mouth daily. 90 tablet 0   lisinopril (ZESTRIL) 20 MG tablet Take 1 tablet (20 mg total) by mouth daily. 90 tablet 1   No  facility-administered medications prior to visit.    Allergies  Allergen Reactions   Cefzil [Cefprozil] Rash    ROS Review of Systems    Objective:    Physical Exam  BP 128/86 (BP Location: Left Arm, Patient Position: Sitting, Cuff Size: Large)   Pulse 63   SpO2 96%  Wt Readings from Last 3 Encounters:  02/21/21 268 lb 4.8 oz (121.7 kg)  12/18/18 250 lb (113.4 kg)     Health Maintenance Due  Topic Date Due   COVID-19 Vaccine (1) Never done   Pneumococcal Vaccine 107-73 Years old (1 - PCV) Never done   HIV Screening  Never done   Hepatitis C Screening  Never done   TETANUS/TDAP  Never done    There are no preventive care reminders to display for this patient.  Lab Results  Component Value Date   TSH 18.29 (H) 02/21/2021   Lab Results  Component Value Date   WBC 5.6 02/21/2021   HGB 14.7 02/21/2021   HCT 42.5 02/21/2021   MCV 92.0 02/21/2021   PLT 275 02/21/2021   Lab Results  Component Value Date   NA 139 02/21/2021   K 3.9 02/21/2021   CO2 30 02/21/2021  GLUCOSE 78 02/21/2021   BUN 11 02/21/2021   CREATININE 0.91 02/21/2021   BILITOT 1.1 02/21/2021   AST 33 02/21/2021   ALT 83 (H) 02/21/2021   PROT 7.2 02/21/2021   CALCIUM 9.7 02/21/2021   EGFR 116 02/21/2021   No results found for: CHOL No results found for: HDL No results found for: LDLCALC No results found for: TRIG No results found for: CHOLHDL No results found for: HGBA1C    Assessment & Plan:  Continue current medications.  Problem List Items Addressed This Visit       Cardiovascular and Mediastinum   Essential hypertension - Primary    No orders of the defined types were placed in this encounter.   Follow-up: Return for as needed.Ninfa Meeker, CMA

## 2021-03-21 NOTE — Progress Notes (Signed)
Medical screening examination/treatment was performed by qualified clinical staff member and as supervising physician I was immediately available for consultation/collaboration. I have reviewed documentation and agree with assessment and plan.  Khaleed Holan, DO  

## 2021-05-25 ENCOUNTER — Other Ambulatory Visit: Payer: Self-pay | Admitting: Family Medicine

## 2021-09-06 ENCOUNTER — Other Ambulatory Visit: Payer: Self-pay | Admitting: Family Medicine

## 2021-09-06 NOTE — Telephone Encounter (Signed)
Please contact the patient to schedule 84-month follow-up appt for HTN. Thanks. Sending refills

## 2021-12-05 ENCOUNTER — Other Ambulatory Visit: Payer: Self-pay | Admitting: Family Medicine

## 2021-12-06 ENCOUNTER — Telehealth: Payer: Self-pay | Admitting: Family Medicine

## 2021-12-06 NOTE — Telephone Encounter (Signed)
Patient is scheduled for a physical on 10/5 but will be out of blood pressure and Thyroid medication. Can a refill be sen to last until the appointment in October?

## 2021-12-12 MED ORDER — LISINOPRIL 20 MG PO TABS: ORAL_TABLET | ORAL | 0 refills | Status: DC

## 2021-12-12 MED ORDER — LEVOTHYROXINE SODIUM 75 MCG PO TABS: ORAL_TABLET | ORAL | 0 refills | Status: DC

## 2021-12-18 NOTE — Telephone Encounter (Signed)
Please contact patient for scheduling. Thanks in advance.

## 2022-01-13 ENCOUNTER — Encounter: Payer: 59 | Admitting: Family Medicine

## 2022-01-16 ENCOUNTER — Encounter: Payer: 59 | Admitting: Family Medicine

## 2022-02-04 ENCOUNTER — Ambulatory Visit (INDEPENDENT_AMBULATORY_CARE_PROVIDER_SITE_OTHER): Payer: 59 | Admitting: Family Medicine

## 2022-02-04 ENCOUNTER — Encounter: Payer: Self-pay | Admitting: Family Medicine

## 2022-02-04 VITALS — BP 122/76 | HR 82 | Ht 71.5 in | Wt 255.0 lb

## 2022-02-04 DIAGNOSIS — Z0000 Encounter for general adult medical examination without abnormal findings: Secondary | ICD-10-CM

## 2022-02-04 DIAGNOSIS — Z13220 Encounter for screening for lipoid disorders: Secondary | ICD-10-CM

## 2022-02-04 DIAGNOSIS — Z23 Encounter for immunization: Secondary | ICD-10-CM | POA: Diagnosis not present

## 2022-02-04 DIAGNOSIS — I10 Essential (primary) hypertension: Secondary | ICD-10-CM

## 2022-02-04 DIAGNOSIS — E039 Hypothyroidism, unspecified: Secondary | ICD-10-CM | POA: Diagnosis not present

## 2022-02-04 NOTE — Progress Notes (Signed)
Cole Baker - 31 y.o. male MRN [id number]  Date of birth: 02/05/1991  Subjective Chief Complaint  Patient presents with   Annual Exam    HPI Cole Baker is a 31 y.o. here today for annual exam.   He reports that he is doing well.  Has history of hypothyroidism and HTN.  Doing well with current medications.   He reports that he is moderately physically active.  He feels like his diet is ok.  Better during the summer when he was following a pescatarian diet.  He occasionally smokes cigars.  He consumes EtOH occasionally.   Review of Systems  Constitutional:  Negative for chills, fever, malaise/fatigue and weight loss.  HENT:  Negative for congestion, ear pain and sore throat.   Eyes:  Negative for blurred vision, double vision and pain.  Respiratory:  Negative for cough and shortness of breath.   Cardiovascular:  Negative for chest pain and palpitations.  Gastrointestinal:  Negative for abdominal pain, blood in stool, constipation, heartburn and nausea.  Genitourinary:  Negative for dysuria and urgency.  Musculoskeletal:  Negative for joint pain and myalgias.  Neurological:  Negative for dizziness and headaches.  Endo/Heme/Allergies:  Does not bruise/bleed easily.  Psychiatric/Behavioral:  Negative for depression. The patient is not nervous/anxious and does not have insomnia.     Allergies  Allergen Reactions   Cefzil [Cefprozil] Rash    Past Medical History:  Diagnosis Date   Hypertension    Thyroid disease     History reviewed. No pertinent surgical history.  Social History   Socioeconomic History   Marital status: Married    Spouse name: Not on file   Number of children: Not on file   Years of education: Not on file   Highest education level: Not on file  Occupational History   Occupation: Production designer, theatre/television/film  Tobacco Use   Smoking status: Some Days    Types: Cigars    Passive exposure: Never   Smokeless tobacco: Never  Vaping Use   Vaping Use: Never used  Substance  and Sexual Activity   Alcohol use: Yes    Alcohol/week: 1.0 - 2.0 standard drink of alcohol    Types: 1 - 2 Standard drinks or equivalent per week   Drug use: Yes    Frequency: 0.2 times per week    Comment: CBD   Sexual activity: Yes  Other Topics Concern   Not on file  Social History Narrative   Not on file   Social Determinants of Health   Financial Resource Strain: Not on file  Food Insecurity: Not on file  Transportation Needs: Not on file  Physical Activity: Not on file  Stress: Not on file  Social Connections: Not on file    Family History  Problem Relation Age of Onset   Stroke Mother    Diabetes Father     Health Maintenance  Topic Date Due   COVID-19 Vaccine (1) Never done   HIV Screening  Never done   Hepatitis C Screening  Never done   INFLUENZA VACCINE  11/12/2021   TETANUS/TDAP  02/05/2023 (Originally 11/24/2009)   HPV VACCINES  Aged Out     ----------------------------------------------------------------------------------------------------------------------------------------------------------------------------------------------------------------- Physical Exam BP 139/86 (BP Location: Left Arm, Patient Position: Sitting, Cuff Size: Large)   Pulse 82   Ht 5' 11.5" (1.816 m)   Wt 255 lb (115.7 kg)   SpO2 98%   BMI 35.07 kg/m   Physical Exam Constitutional:      General: He is not in  acute distress. HENT:     Head: Normocephalic and atraumatic.     Right Ear: Tympanic membrane and external ear normal.     Left Ear: Tympanic membrane and external ear normal.  Eyes:     General: No scleral icterus. Neck:     Thyroid: No thyromegaly.  Cardiovascular:     Rate and Rhythm: Normal rate and regular rhythm.     Heart sounds: Normal heart sounds.  Pulmonary:     Effort: Pulmonary effort is normal.     Breath sounds: Normal breath sounds.  Abdominal:     General: Bowel sounds are normal. There is no distension.     Palpations: Abdomen is soft.      Tenderness: There is no abdominal tenderness. There is no guarding.  Musculoskeletal:     Cervical back: Normal range of motion.  Lymphadenopathy:     Cervical: No cervical adenopathy.  Skin:    General: Skin is warm and dry.     Findings: No rash.  Neurological:     Mental Status: He is alert and oriented to person, place, and time.     Cranial Nerves: No cranial nerve deficit.     Motor: No abnormal muscle tone.  Psychiatric:        Mood and Affect: Mood normal.        Behavior: Behavior normal.     ------------------------------------------------------------------------------------------------------------------------------------------------------------------------------------------------------------------- Assessment and Plan  Acquired hypothyroidism Well adult Orders Placed This Encounter  Procedures   [place] Fall 2023 Covid-19 Vaccine 32yrs and older   Flu Vaccine QUAD 6+ mos PF IM (Fluarix Quad PF)   COMPLETE METABOLIC PANEL WITH GFR   CBC with Differential   TSH   Lipid Panel w/reflex Direct LDL  Screenings: per lab orders Immunizations: flu and covid vaccines given.  Anticipatory guidance/Risk factor reduction:  Recommendations per AVS.    No orders of the defined types were placed in this encounter.   No follow-ups on file.    This visit occurred during the SARS-CoV-2 public health emergency.  Safety protocols were in place, including screening questions prior to the visit, additional usage of staff PPE, and extensive cleaning of exam room while observing appropriate contact time as indicated for disinfecting solutions.

## 2022-02-04 NOTE — Assessment & Plan Note (Signed)
Well adult Orders Placed This Encounter  Procedures  . [place] Covid-19 Vaccine 45yrs and older  . Flu Vaccine QUAD 6+ mos PF IM (Fluarix Quad PF)  . COMPLETE METABOLIC PANEL WITH GFR  . CBC with Differential  . TSH  . Lipid Panel w/reflex Direct LDL  Screenings: per lab orders Immunizations: flu and covid vaccines given.  Anticipatory guidance/Risk factor reduction:  Recommendations per AVS.

## 2022-02-04 NOTE — Patient Instructions (Signed)
Preventive Care 21-31 Years Old, Male Preventive care refers to lifestyle choices and visits with your health care provider that can promote health and wellness. Preventive care visits are also called wellness exams. What can I expect for my preventive care visit? Counseling During your preventive care visit, your health care provider may ask about your: Medical history, including: Past medical problems. Family medical history. Current health, including: Emotional well-being. Home life and relationship well-being. Sexual activity. Lifestyle, including: Alcohol, nicotine or tobacco, and drug use. Access to firearms. Diet, exercise, and sleep habits. Safety issues such as seatbelt and bike helmet use. Sunscreen use. Work and work environment. Physical exam Your health care provider may check your: Height and weight. These may be used to calculate your BMI (body mass index). BMI is a measurement that tells if you are at a healthy weight. Waist circumference. This measures the distance around your waistline. This measurement also tells if you are at a healthy weight and may help predict your risk of certain diseases, such as type 2 diabetes and high blood pressure. Heart rate and blood pressure. Body temperature. Skin for abnormal spots. What immunizations do I need?  Vaccines are usually given at various ages, according to a schedule. Your health care provider will recommend vaccines for you based on your age, medical history, and lifestyle or other factors, such as travel or where you work. What tests do I need? Screening Your health care provider may recommend screening tests for certain conditions. This may include: Lipid and cholesterol levels. Diabetes screening. This is done by checking your blood sugar (glucose) after you have not eaten for a while (fasting). Hepatitis B test. Hepatitis C test. HIV (human immunodeficiency virus) test. STI (sexually transmitted infection)  testing, if you are at risk. Talk with your health care provider about your test results, treatment options, and if necessary, the need for more tests. Follow these instructions at home: Eating and drinking  Eat a healthy diet that includes fresh fruits and vegetables, whole grains, lean protein, and low-fat dairy products. Drink enough fluid to keep your urine pale yellow. Take vitamin and mineral supplements as recommended by your health care provider. Do not drink alcohol if your health care provider tells you not to drink. If you drink alcohol: Limit how much you have to 0-2 drinks a day. Know how much alcohol is in your drink. In the [place] one drink equals one 12 oz bottle of beer (355 mL), one 5 oz glass of wine (148 mL), or one 1 oz glass of hard liquor (44 mL). Lifestyle Brush your teeth every morning and night with fluoride toothpaste. Floss one time each day. Exercise for at least 30 minutes 5 or more days each week. Do not use any products that contain nicotine or tobacco. These products include cigarettes, chewing tobacco, and vaping devices, such as e-cigarettes. If you need help quitting, ask your health care provider. Do not use drugs. If you are sexually active, practice safe sex. Use a condom or other form of protection to prevent STIs. Find healthy ways to manage stress, such as: Meditation, yoga, or listening to music. Journaling. Talking to a trusted person. Spending time with friends and family. Minimize exposure to UV radiation to reduce your risk of skin cancer. Safety Always wear your seat belt while driving or riding in a vehicle. Do not drive: If you have been drinking alcohol. Do not ride with someone who has been drinking. If you have been using any mind-altering substances   or drugs. While texting. When you are tired or distracted. Wear a helmet and other protective equipment during sports activities. If you have firearms in your house, make sure you  follow all gun safety procedures. Seek help if you have been physically or sexually abused. What's next? Go to your health care provider once a year for an annual wellness visit. Ask your health care provider how often you should have your eyes and teeth checked. Stay up to date on all vaccines. This information is not intended to replace advice given to you by your health care provider. Make sure you discuss any questions you have with your health care provider. Document Revised: 09/26/2020 Document Reviewed: 09/26/2020 Elsevier Patient Education  2023 Elsevier Inc.  

## 2022-02-05 LAB — COMPLETE METABOLIC PANEL WITH GFR
AG Ratio: 2 (calc) (ref 1.0–2.5)
ALT: 63 U/L — ABNORMAL HIGH (ref 9–46)
AST: 32 U/L (ref 10–40)
Albumin: 4.9 g/dL (ref 3.6–5.1)
Alkaline phosphatase (APISO): 39 U/L (ref 36–130)
BUN: 12 mg/dL (ref 7–25)
CO2: 25 mmol/L (ref 20–32)
Calcium: 9.6 mg/dL (ref 8.6–10.3)
Chloride: 105 mmol/L (ref 98–110)
Creat: 0.85 mg/dL (ref 0.60–1.26)
Globulin: 2.5 g/dL (calc) (ref 1.9–3.7)
Glucose, Bld: 99 mg/dL (ref 65–99)
Potassium: 4.2 mmol/L (ref 3.5–5.3)
Sodium: 140 mmol/L (ref 135–146)
Total Bilirubin: 1.3 mg/dL — ABNORMAL HIGH (ref 0.2–1.2)
Total Protein: 7.4 g/dL (ref 6.1–8.1)
eGFR: 119 mL/min/{1.73_m2} (ref >=60)

## 2022-02-05 LAB — CBC WITH DIFFERENTIAL/PLATELET
Absolute Monocytes: 324 cells/uL (ref 200–950)
Basophils Absolute: 41 cells/uL (ref 0–200)
Basophils Relative: 1 %
Eosinophils Absolute: 70 cells/uL (ref 15–500)
Eosinophils Relative: 1.7 %
HCT: 45.1 % (ref 38.5–50.0)
Hemoglobin: 15 g/dL (ref 13.2–17.1)
Lymphs Abs: 1734 cells/uL (ref 850–3900)
MCH: 30.8 pg (ref 27.0–33.0)
MCHC: 33.3 g/dL (ref 32.0–36.0)
MCV: 92.6 fL (ref 80.0–100.0)
MPV: 11.2 fL (ref 7.5–12.5)
Monocytes Relative: 7.9 %
Neutro Abs: 1931 cells/uL (ref 1500–7800)
Neutrophils Relative %: 47.1 %
Platelets: 237 10*3/uL (ref 140–400)
RBC: 4.87 10*6/uL (ref 4.20–5.80)
RDW: 12.5 % (ref 11.0–15.0)
Total Lymphocyte: 42.3 %
WBC: 4.1 10*3/uL (ref 3.8–10.8)

## 2022-02-05 LAB — LIPID PANEL W/REFLEX DIRECT LDL
Cholesterol: 221 mg/dL — ABNORMAL HIGH (ref <200)
HDL: 50 mg/dL (ref >=40)
LDL Cholesterol (Calc): 153 mg/dL (calc) — ABNORMAL HIGH
Non-HDL Cholesterol (Calc): 171 mg/dL (calc) — ABNORMAL HIGH (ref <130)
Total CHOL/HDL Ratio: 4.4 (calc) (ref <5.0)
Triglycerides: 81 mg/dL (ref <150)

## 2022-02-05 LAB — TSH: TSH: 19.09 mIU/L — ABNORMAL HIGH (ref 0.40–4.50)

## 2022-02-06 ENCOUNTER — Other Ambulatory Visit: Payer: Self-pay | Admitting: Family Medicine

## 2022-02-11 ENCOUNTER — Other Ambulatory Visit: Payer: Self-pay | Admitting: Family Medicine

## 2022-02-11 ENCOUNTER — Other Ambulatory Visit (HOSPITAL_COMMUNITY): Payer: Self-pay

## 2022-02-11 DIAGNOSIS — E039 Hypothyroidism, unspecified: Secondary | ICD-10-CM

## 2022-02-11 MED ORDER — LEVOTHYROXINE SODIUM 88 MCG PO TABS
88.0000 ug | ORAL_TABLET | Freq: Every day | ORAL | 3 refills | Status: DC
  Filled 2022-02-11: qty 30, 30d supply, fill #0

## 2022-02-12 ENCOUNTER — Other Ambulatory Visit (HOSPITAL_COMMUNITY): Payer: Self-pay

## 2022-02-13 ENCOUNTER — Encounter (HOSPITAL_COMMUNITY): Payer: Self-pay

## 2022-02-13 ENCOUNTER — Encounter: Payer: Self-pay | Admitting: Family Medicine

## 2022-02-13 ENCOUNTER — Other Ambulatory Visit (HOSPITAL_COMMUNITY): Payer: Self-pay

## 2022-02-13 ENCOUNTER — Other Ambulatory Visit: Payer: Self-pay | Admitting: Family Medicine

## 2022-02-13 MED ORDER — LEVOTHYROXINE SODIUM 88 MCG PO TABS: 88.0000 ug | ORAL_TABLET | Freq: Every day | ORAL | 3 refills | Status: DC

## 2022-02-20 ENCOUNTER — Other Ambulatory Visit (HOSPITAL_COMMUNITY): Payer: Self-pay

## 2022-02-21 ENCOUNTER — Other Ambulatory Visit (HOSPITAL_COMMUNITY): Payer: Self-pay

## 2022-05-12 ENCOUNTER — Other Ambulatory Visit: Payer: Self-pay | Admitting: Family Medicine

## 2022-06-30 ENCOUNTER — Other Ambulatory Visit: Payer: Self-pay | Admitting: Family Medicine

## 2022-06-30 MED ORDER — LISINOPRIL 20 MG PO TABS: ORAL_TABLET | ORAL | 0 refills | Status: DC

## 2022-08-06 ENCOUNTER — Encounter: Payer: Self-pay | Admitting: Family Medicine

## 2022-08-06 ENCOUNTER — Ambulatory Visit (INDEPENDENT_AMBULATORY_CARE_PROVIDER_SITE_OTHER): Payer: 59 | Admitting: Family Medicine

## 2022-08-06 VITALS — BP 115/73 | HR 77 | Ht 71.5 in | Wt 257.0 lb

## 2022-08-06 DIAGNOSIS — Z8349 Family history of other endocrine, nutritional and metabolic diseases: Secondary | ICD-10-CM | POA: Diagnosis not present

## 2022-08-06 DIAGNOSIS — I10 Essential (primary) hypertension: Secondary | ICD-10-CM | POA: Diagnosis not present

## 2022-08-06 DIAGNOSIS — E039 Hypothyroidism, unspecified: Secondary | ICD-10-CM | POA: Diagnosis not present

## 2022-08-06 NOTE — Progress Notes (Signed)
Cole Baker - 32 y.o. male MRN [id number]  Date of birth: May 19, 1990  Subjective Chief Complaint  Patient presents with   Hypertension    HPI Cole Baker is a 32 y.o. male here today for follow up visit.   He reports that he is doing pretty well.  He and his wife are expecting a baby girl over the Summer.  He recently started a new job as well.   Continues on lisinopril for management of HTN.  He has not had symptoms related to HTN including chest pain, shortness of breath, palpitations, headache or vision change.   Remains on levothyroxine. Dose increased previously.  He is due for updated labs.   He reports a that his mom was recently dx with hemochromatosis.  It was recommended that he have testing as well.   ROS:  A comprehensive ROS was completed and negative except as noted per HPI  Allergies  Allergen Reactions   Cefzil [Cefprozil] Rash    Past Medical History:  Diagnosis Date   Hypertension    Thyroid disease     History reviewed. No pertinent surgical history.  Social History   Socioeconomic History   Marital status: Married    Spouse name: Not on file   Number of children: Not on file   Years of education: Not on file   Highest education level: Not on file  Occupational History   Occupation: Production designer, theatre/television/film  Tobacco Use   Smoking status: Some Days    Types: Cigars    Passive exposure: Never   Smokeless tobacco: Never  Vaping Use   Vaping Use: Never used  Substance and Sexual Activity   Alcohol use: Yes    Alcohol/week: 1.0 - 2.0 standard drink of alcohol    Types: 1 - 2 Standard drinks or equivalent per week   Drug use: Yes    Frequency: 0.2 times per week    Comment: CBD   Sexual activity: Yes  Other Topics Concern   Not on file  Social History Narrative   Not on file   Social Determinants of Health   Financial Resource Strain: Not on file  Food Insecurity: Not on file  Transportation Needs: Not on file  Physical Activity: Not on file  Stress:  Not on file  Social Connections: Not on file    Family History  Problem Relation Age of Onset   Stroke Mother    Hemochromatosis Mother    Diabetes Father     Health Maintenance  Topic Date Due   HIV Screening  Never done   Hepatitis C Screening  Never done   INFLUENZA VACCINE  11/13/2022   DTaP/Tdap/Td (2 - Td or Tdap) 03/04/2025   COVID-19 Vaccine  Completed   HPV VACCINES  Aged Out     ----------------------------------------------------------------------------------------------------------------------------------------------------------------------------------------------------------------- Physical Exam BP 133/89 (BP Location: Left Arm, Patient Position: Sitting, Cuff Size: Large)   Pulse 77   Ht 5' 11.5" (1.816 m)   Wt 257 lb (116.6 kg)   SpO2 98%   BMI 35.34 kg/m   Physical Exam Constitutional:      Appearance: Normal appearance.  HENT:     Head: Normocephalic and atraumatic.  Eyes:     General: No scleral icterus. Cardiovascular:     Rate and Rhythm: Normal rate. Rhythm irregular.  Pulmonary:     Effort: Pulmonary effort is normal.     Breath sounds: Normal breath sounds.  Musculoskeletal:     Cervical back: Neck supple.  Neurological:  Mental Status: He is alert.  Psychiatric:        Mood and Affect: Mood normal.        Behavior: Behavior normal.     ------------------------------------------------------------------------------------------------------------------------------------------------------------------------------------------------------------------- Assessment and Plan  Essential hypertension BP is pretty well controlled.  I asked him to check at home as well.  Continue current strength of lisinopril.   Acquired hypothyroidism Update Tsh.    Family history of hemochromatosis Checking LFT's and Ferritin levels.    No orders of the defined types were placed in this encounter.   No follow-ups on file.    This visit occurred  during the SARS-CoV-2 public health emergency.  Safety protocols were in place, including screening questions prior to the visit, additional usage of staff PPE, and extensive cleaning of exam room while observing appropriate contact time as indicated for disinfecting solutions.

## 2022-08-06 NOTE — Assessment & Plan Note (Signed)
Checking LFT's and Ferritin levels.

## 2022-08-06 NOTE — Assessment & Plan Note (Signed)
BP is pretty well controlled.  I asked him to check at home as well.  Continue current strength of lisinopril.

## 2022-08-06 NOTE — Assessment & Plan Note (Signed)
Update Tsh.

## 2022-08-07 LAB — COMPLETE METABOLIC PANEL WITH GFR
AG Ratio: 1.8 (calc) (ref 1.0–2.5)
ALT: 64 U/L — ABNORMAL HIGH (ref 9–46)
AST: 30 U/L (ref 10–40)
Albumin: 4.8 g/dL (ref 3.6–5.1)
Alkaline phosphatase (APISO): 37 U/L (ref 36–130)
BUN: 13 mg/dL (ref 7–25)
CO2: 29 mmol/L (ref 20–32)
Calcium: 10 mg/dL (ref 8.6–10.3)
Chloride: 103 mmol/L (ref 98–110)
Creat: 0.93 mg/dL (ref 0.60–1.26)
Globulin: 2.7 g/dL (calc) (ref 1.9–3.7)
Glucose, Bld: 96 mg/dL (ref 65–99)
Potassium: 4.4 mmol/L (ref 3.5–5.3)
Sodium: 139 mmol/L (ref 135–146)
Total Bilirubin: 1.5 mg/dL — ABNORMAL HIGH (ref 0.2–1.2)
Total Protein: 7.5 g/dL (ref 6.1–8.1)
eGFR: 113 mL/min/{1.73_m2} (ref >=60)

## 2022-08-07 LAB — IRON,TIBC AND FERRITIN PANEL
%SAT: 31 % (calc) (ref 20–48)
Ferritin: 176 ng/mL (ref 38–380)
Iron: 112 ug/dL (ref 50–180)
TIBC: 359 mcg/dL (calc) (ref 250–425)

## 2022-08-07 LAB — TSH: TSH: 10.28 mIU/L — ABNORMAL HIGH (ref 0.40–4.50)

## 2022-08-15 ENCOUNTER — Other Ambulatory Visit: Payer: Self-pay | Admitting: Family Medicine

## 2022-08-15 DIAGNOSIS — E039 Hypothyroidism, unspecified: Secondary | ICD-10-CM

## 2022-08-15 MED ORDER — LEVOTHYROXINE SODIUM 100 MCG PO TABS: 100.0000 ug | ORAL_TABLET | Freq: Every day | ORAL | 0 refills | Status: DC

## 2022-11-21 ENCOUNTER — Other Ambulatory Visit: Payer: Self-pay | Admitting: Family Medicine

## 2022-11-21 DIAGNOSIS — E039 Hypothyroidism, unspecified: Secondary | ICD-10-CM

## 2022-11-21 NOTE — Telephone Encounter (Signed)
Spoke with patient, patient will come in next week to get labs, thanks.

## 2022-11-21 NOTE — Telephone Encounter (Signed)
Pls contact the patient to remind him of outstanding TSH labs being due. Thanks

## 2022-11-26 ENCOUNTER — Other Ambulatory Visit: Payer: Self-pay | Admitting: Family Medicine

## 2022-11-26 DIAGNOSIS — E039 Hypothyroidism, unspecified: Secondary | ICD-10-CM

## 2022-11-26 DIAGNOSIS — Z0000 Encounter for general adult medical examination without abnormal findings: Secondary | ICD-10-CM

## 2022-11-26 DIAGNOSIS — I10 Essential (primary) hypertension: Secondary | ICD-10-CM

## 2022-11-26 DIAGNOSIS — Z13220 Encounter for screening for lipoid disorders: Secondary | ICD-10-CM

## 2022-11-27 ENCOUNTER — Other Ambulatory Visit: Payer: Self-pay | Admitting: Family Medicine

## 2022-11-27 DIAGNOSIS — E039 Hypothyroidism, unspecified: Secondary | ICD-10-CM

## 2022-11-27 LAB — CMP14+EGFR
ALT: 65 IU/L — ABNORMAL HIGH (ref 0–44)
AST: 33 IU/L (ref 0–40)
Albumin: 4.9 g/dL (ref 4.1–5.1)
Alkaline Phosphatase: 39 IU/L — ABNORMAL LOW (ref 44–121)
BUN/Creatinine Ratio: 13 (ref 9–20)
BUN: 12 mg/dL (ref 6–20)
Bilirubin Total: 1 mg/dL (ref 0.0–1.2)
CO2: 22 mmol/L (ref 20–29)
Calcium: 9.8 mg/dL (ref 8.7–10.2)
Chloride: 103 mmol/L (ref 96–106)
Creatinine, Ser: 0.93 mg/dL (ref 0.76–1.27)
Globulin, Total: 2.2 g/dL (ref 1.5–4.5)
Glucose: 98 mg/dL (ref 70–99)
Potassium: 4.2 mmol/L (ref 3.5–5.2)
Sodium: 139 mmol/L (ref 134–144)
Total Protein: 7.1 g/dL (ref 6.0–8.5)
eGFR: 112 mL/min/{1.73_m2} (ref >59)

## 2022-11-27 LAB — CBC WITH DIFFERENTIAL/PLATELET
Basophils Absolute: 0 10*3/uL (ref 0.0–0.2)
Basos: 1 %
EOS (ABSOLUTE): 0.1 10*3/uL (ref 0.0–0.4)
Eos: 2 %
Hematocrit: 42.5 % (ref 37.5–51.0)
Hemoglobin: 14.4 g/dL (ref 13.0–17.7)
Immature Grans (Abs): 0 10*3/uL (ref 0.0–0.1)
Immature Granulocytes: 0 %
Lymphocytes Absolute: 1.9 10*3/uL (ref 0.7–3.1)
Lymphs: 46 %
MCH: 30.7 pg (ref 26.6–33.0)
MCHC: 33.9 g/dL (ref 31.5–35.7)
MCV: 91 fL (ref 79–97)
Monocytes Absolute: 0.3 10*3/uL (ref 0.1–0.9)
Monocytes: 7 %
Neutrophils Absolute: 1.9 10*3/uL (ref 1.4–7.0)
Neutrophils: 44 %
Platelets: 223 10*3/uL (ref 150–450)
RBC: 4.69 x10E6/uL (ref 4.14–5.80)
RDW: 12 % (ref 11.6–15.4)
WBC: 4.2 10*3/uL (ref 3.4–10.8)

## 2022-11-27 LAB — LIPID PANEL WITH LDL/HDL RATIO
Cholesterol, Total: 208 mg/dL — ABNORMAL HIGH (ref 100–199)
HDL: 49 mg/dL (ref >39)
LDL Chol Calc (NIH): 140 mg/dL — ABNORMAL HIGH (ref 0–99)
LDL/HDL Ratio: 2.9 ratio (ref 0.0–3.6)
Triglycerides: 104 mg/dL (ref 0–149)
VLDL Cholesterol Cal: 19 mg/dL (ref 5–40)

## 2022-11-27 LAB — TSH: TSH: 8.49 u[IU]/mL — ABNORMAL HIGH (ref 0.450–4.500)

## 2022-11-27 MED ORDER — LEVOTHYROXINE SODIUM 125 MCG PO TABS: 125.0000 ug | ORAL_TABLET | Freq: Every day | ORAL | 0 refills | Status: DC

## 2022-11-30 ENCOUNTER — Other Ambulatory Visit: Payer: Self-pay | Admitting: Family Medicine

## 2022-12-01 MED ORDER — LISINOPRIL 20 MG PO TABS: ORAL_TABLET | ORAL | 0 refills | Status: DC

## 2022-12-11 ENCOUNTER — Encounter: Payer: Self-pay | Admitting: Family Medicine

## 2023-01-30 ENCOUNTER — Other Ambulatory Visit: Payer: Self-pay | Admitting: Family Medicine

## 2023-02-05 ENCOUNTER — Ambulatory Visit: Payer: 59 | Admitting: Family Medicine

## 2023-02-09 ENCOUNTER — Encounter: Payer: Self-pay | Admitting: Family Medicine

## 2023-02-09 ENCOUNTER — Ambulatory Visit (INDEPENDENT_AMBULATORY_CARE_PROVIDER_SITE_OTHER): Payer: 59 | Admitting: Family Medicine

## 2023-02-09 VITALS — BP 133/85 | HR 64 | Ht 71.5 in | Wt 262.0 lb

## 2023-02-09 DIAGNOSIS — Z0000 Encounter for general adult medical examination without abnormal findings: Secondary | ICD-10-CM

## 2023-02-09 DIAGNOSIS — Z23 Encounter for immunization: Secondary | ICD-10-CM

## 2023-02-09 DIAGNOSIS — E039 Hypothyroidism, unspecified: Secondary | ICD-10-CM

## 2023-02-09 NOTE — Progress Notes (Signed)
Cole Baker - 32 y.o. male MRN [id number]  Date of birth: [birth year]  Subjective Chief Complaint  Patient presents with   Hypertension    HPI Cole Baker is a 32 y.o. male here today for annual exam.   Reports that he is doing well.  TSH elevated on labs in August.  Levothyroxine increased at that time.  Updated TSH due.   He continues to stay moderately active.  He feels that diet has been pretty good.   He is a non-smoker.  Occasional EtOH use.   Review of Systems  Constitutional:  Negative for chills, fever, malaise/fatigue and weight loss.  HENT:  Negative for congestion, ear pain and sore throat.   Eyes:  Negative for blurred vision, double vision and pain.  Respiratory:  Negative for cough and shortness of breath.   Cardiovascular:  Negative for chest pain and palpitations.  Gastrointestinal:  Negative for abdominal pain, blood in stool, constipation, heartburn and nausea.  Genitourinary:  Negative for dysuria and urgency.  Musculoskeletal:  Negative for joint pain and myalgias.  Neurological:  Negative for dizziness and headaches.  Endo/Heme/Allergies:  Does not bruise/bleed easily.  Psychiatric/Behavioral:  Negative for depression. The patient is not nervous/anxious and does not have insomnia.     Allergies  Allergen Reactions   Cefzil [Cefprozil] Rash    Past Medical History:  Diagnosis Date   Hypertension    Thyroid disease     History reviewed. No pertinent surgical history.  Social History   Socioeconomic History   Marital status: Married    Spouse name: Not on file   Number of children: Not on file   Years of education: Not on file   Highest education level: Bachelor's degree (e.g., BA, AB, BS)  Occupational History   Occupation: Production designer, theatre/television/film  Tobacco Use   Smoking status: Some Days    Types: Cigars    Passive exposure: Never   Smokeless tobacco: Never  Vaping Use   Vaping status: Never Used  Substance and Sexual Activity   Alcohol use: Yes     Alcohol/week: 1.0 - 2.0 standard drink of alcohol    Types: 1 - 2 Standard drinks or equivalent per week   Drug use: Yes    Frequency: 0.2 times per week    Comment: CBD   Sexual activity: Yes  Other Topics Concern   Not on file  Social History Narrative   Not on file   Social Determinants of Health   Financial Resource Strain: Low Risk  (02/02/2023)   Overall Financial Resource Strain (CARDIA)    Difficulty of Paying Living Expenses: Not hard at all  Food Insecurity: No Food Insecurity (02/02/2023)   Hunger Vital Sign    Worried About Running Out of Food in the Last Year: Never true    Ran Out of Food in the Last Year: Never true  Transportation Needs: No Transportation Needs (02/02/2023)   PRAPARE - Administrator, Civil Service (Medical): No    Lack of Transportation (Non-Medical): No  Physical Activity: Unknown (02/02/2023)   Exercise Vital Sign    Days of Exercise per Week: 0 days    Minutes of Exercise per Session: Not on file  Stress: No Stress Concern Present (02/02/2023)   [employer] of Occupational Health - Occupational Stress Questionnaire    Feeling of Stress : Only a little  Social Connections: Unknown (02/02/2023)   Social Connection and Isolation Panel [NHANES]    Frequency of Communication with Friends and  Family: Twice a week    Frequency of Social Gatherings with Friends and Family: Patient declined    Attends Religious Services: More than 4 times per year    Active Member of [employer] or Organizations: Yes    Attends Engineer, structural: More than 4 times per year    Marital Status: Married    Family History  Problem Relation Age of Onset   Stroke Mother    Hemochromatosis Mother    Diabetes Father     Health Maintenance  Topic Date Due   HIV Screening  Never done   Hepatitis C Screening  Never done   DTaP/Tdap/Td (2 - Td or Tdap) 03/04/2025   INFLUENZA VACCINE  Completed   COVID-19 Vaccine  Completed   HPV VACCINES   Aged Out     ----------------------------------------------------------------------------------------------------------------------------------------------------------------------------------------------------------------- Physical Exam BP 133/85 (BP Location: Left Arm, Patient Position: Sitting, Cuff Size: Large)   Pulse 64   Ht 5' 11.5" (1.816 m)   Wt 262 lb (118.8 kg)   SpO2 99%   BMI 36.03 kg/m   Physical Exam Constitutional:      General: He is not in acute distress. HENT:     Head: Normocephalic and atraumatic.     Right Ear: Tympanic membrane and external ear normal.     Left Ear: Tympanic membrane and external ear normal.  Eyes:     General: No scleral icterus. Neck:     Thyroid: No thyromegaly.  Cardiovascular:     Rate and Rhythm: Normal rate and regular rhythm.     Heart sounds: Normal heart sounds.  Pulmonary:     Effort: Pulmonary effort is normal.     Breath sounds: Normal breath sounds.  Abdominal:     General: Bowel sounds are normal. There is no distension.     Palpations: Abdomen is soft.     Tenderness: There is no abdominal tenderness. There is no guarding.  Musculoskeletal:     Cervical back: Normal range of motion.  Lymphadenopathy:     Cervical: No cervical adenopathy.  Skin:    General: Skin is warm and dry.     Findings: No rash.  Neurological:     Mental Status: He is alert and oriented to person, place, and time.     Cranial Nerves: No cranial nerve deficit.     Motor: No abnormal muscle tone.  Psychiatric:        Mood and Affect: Mood normal.        Behavior: Behavior normal.     ------------------------------------------------------------------------------------------------------------------------------------------------------------------------------------------------------------------- Assessment and Plan  Well adult exam Well adult Orders Placed This Encounter  Procedures   Flu vaccine trivalent PF, 6mos and  older(Flulaval,Afluria,Fluarix,Fluzone)   Pfizer Comirnaty Covid -19 Vaccine 71yrs and older   TSH  Screenings: per lab orders Immunizations:  Flu and COVID given today.  Anticipatory guidance/Risk factor reduction:  Recommendations per AVS.   Acquired hypothyroidism TSH elevated previously and medication adjusted.  Update TSH.    No orders of the defined types were placed in this encounter.   Return in about 6 months (around 08/10/2023) for Thyroid, Hypertension.    This visit occurred during the SARS-CoV-2 public health emergency.  Safety protocols were in place, including screening questions prior to the visit, additional usage of staff PPE, and extensive cleaning of exam room while observing appropriate contact time as indicated for disinfecting solutions.

## 2023-02-09 NOTE — Assessment & Plan Note (Signed)
TSH elevated previously and medication adjusted.  Update TSH.

## 2023-02-09 NOTE — Assessment & Plan Note (Signed)
Well adult Orders Placed This Encounter  Procedures   Flu vaccine trivalent PF, 6mos and older(Flulaval,Afluria,Fluarix,Fluzone)   Pfizer Comirnaty Covid -19 Vaccine 81yrs and older   TSH  Screenings: per lab orders Immunizations:  Flu and COVID given today.  Anticipatory guidance/Risk factor reduction:  Recommendations per AVS.

## 2023-02-10 LAB — TSH: TSH: 4.9 u[IU]/mL — ABNORMAL HIGH (ref 0.450–4.500)

## 2023-02-19 ENCOUNTER — Other Ambulatory Visit: Payer: Self-pay | Admitting: Family Medicine

## 2023-02-19 DIAGNOSIS — E039 Hypothyroidism, unspecified: Secondary | ICD-10-CM

## 2023-02-19 MED ORDER — LEVOTHYROXINE SODIUM 137 MCG PO TABS: 137.0000 ug | ORAL_TABLET | Freq: Every day | ORAL | 1 refills | Status: DC

## 2023-03-07 ENCOUNTER — Other Ambulatory Visit: Payer: Self-pay | Admitting: Family Medicine

## 2023-06-11 ENCOUNTER — Other Ambulatory Visit: Payer: Self-pay | Admitting: Family Medicine

## 2023-06-17 ENCOUNTER — Encounter: Payer: Self-pay | Admitting: Family Medicine

## 2023-06-18 MED ORDER — LISINOPRIL 20 MG PO TABS: 20.0000 mg | ORAL_TABLET | Freq: Every day | ORAL | 0 refills | Status: DC

## 2023-07-17 LAB — TSH: TSH: 5 u[IU]/mL — ABNORMAL HIGH (ref 0.450–4.500)

## 2023-07-24 ENCOUNTER — Encounter: Payer: Self-pay | Admitting: Family Medicine

## 2023-08-10 ENCOUNTER — Ambulatory Visit: Payer: 59 | Admitting: Family Medicine

## 2023-08-10 ENCOUNTER — Encounter: Payer: Self-pay | Admitting: Family Medicine

## 2023-08-10 VITALS — BP 150/99 | HR 68 | Ht 71.5 in | Wt 258.0 lb

## 2023-08-10 DIAGNOSIS — I10 Essential (primary) hypertension: Secondary | ICD-10-CM

## 2023-08-10 DIAGNOSIS — E039 Hypothyroidism, unspecified: Secondary | ICD-10-CM | POA: Diagnosis not present

## 2023-08-10 NOTE — Progress Notes (Signed)
 Cole Baker - 33 y.o. male MRN [id number]  Date of birth: [birth year]  Subjective Chief Complaint  Patient presents with   Hypertension   Thyroid  Problem    HPI Cole Baker is a 33 y.o. male here today for follow up .   He has history of HTN treated with lisinopril  20mg  daily.  He is tolerating this well at current strength.  He denies chest pain, shortness of breath, palpitations, headache or vision changes.   Taking levothyroxine  daily at 137mcg.  He denies new symptoms related to thyroid .  Last TSH mildly elevated at 5.0.    Allergies  Allergen Reactions   Cefzil [Cefprozil] Rash    Past Medical History:  Diagnosis Date   Hypertension    Thyroid  disease     History reviewed. No pertinent surgical history.  Social History   Socioeconomic History   Marital status: Married    Spouse name: Not on file   Number of children: Not on file   Years of education: Not on file   Highest education level: Bachelor's degree (e.g., BA, AB, BS)  Occupational History   Occupation: Production designer, theatre/television/film  Tobacco Use   Smoking status: Some Days    Types: Cigars    Passive exposure: Never   Smokeless tobacco: Never  Vaping Use   Vaping status: Never Used  Substance and Sexual Activity   Alcohol use: Yes    Alcohol/week: 1.0 - 2.0 standard drink of alcohol    Types: 1 - 2 Standard drinks or equivalent per week   Drug use: Yes    Frequency: 0.2 times per week    Comment: CBD   Sexual activity: Yes  Other Topics Concern   Not on file  Social History Narrative   Not on file   Social Drivers of Health   Financial Resource Strain: Low Risk  (08/03/2023)   Overall Financial Resource Strain (CARDIA)    Difficulty of Paying Living Expenses: Not hard at all  Food Insecurity: No Food Insecurity (08/03/2023)   Hunger Vital Sign    Worried About Running Out of Food in the Last Year: Never true    Ran Out of Food in the Last Year: Never true  Transportation Needs: No Transportation Needs (08/03/2023)    PRAPARE - Administrator, Civil Service (Medical): No    Lack of Transportation (Non-Medical): No  Physical Activity: Unknown (08/03/2023)   Exercise Vital Sign    Days of Exercise per Week: 0 days    Minutes of Exercise per Session: Not on file  Stress: No Stress Concern Present (08/03/2023)   [employer] of Occupational Health - Occupational Stress Questionnaire    Feeling of Stress : Not at all  Social Connections: Socially Integrated (08/03/2023)   Social Connection and Isolation Panel [NHANES]    Frequency of Communication with Friends and Family: Twice a week    Frequency of Social Gatherings with Friends and Family: Once a week    Attends Religious Services: More than 4 times per year    Active Member of [employer] or Organizations: Yes    Attends Engineer, structural: More than 4 times per year    Marital Status: Married    Family History  Problem Relation Age of Onset   Stroke Mother    Hemochromatosis Mother    Diabetes Father     Health Maintenance  Topic Date Due   HIV Screening  Never done   Hepatitis C Screening  Never done  Pneumococcal Vaccine 35-26 Years old (1 of 2 - PCV) Never done   INFLUENZA VACCINE  11/13/2023   DTaP/Tdap/Td (2 - Td or Tdap) 03/04/2025   COVID-19 Vaccine  Completed   HPV VACCINES  Aged Out   Meningococcal B Vaccine  Aged Out     ----------------------------------------------------------------------------------------------------------------------------------------------------------------------------------------------------------------- Physical Exam BP (!) 150/99   Pulse 68   Ht 5' 11.5" (1.816 m)   Wt 258 lb (117 kg)   SpO2 99%   BMI 35.48 kg/m   Physical Exam Constitutional:      Appearance: Normal appearance.  HENT:     Head: Normocephalic and atraumatic.  Cardiovascular:     Rate and Rhythm: Normal rate and regular rhythm.  Neurological:     Mental Status: He is alert.      ------------------------------------------------------------------------------------------------------------------------------------------------------------------------------------------------------------------- Assessment and Plan  Acquired hypothyroidism Recent TSH mildly elevated.  Reminded to take first thing in the morning on an empty stomach. Considering seeing [facility].  I expressed caution with this and some of their practices not being backed by sufficient evidence.  Recheck TSH in 3 months.   Essential hypertension BP elevated today.  Return in 2 weeks for BP recheck.    No orders of the defined types were placed in this encounter.   Return in about 2 weeks (around 08/24/2023) for nurse visit for BP check.

## 2023-08-10 NOTE — Assessment & Plan Note (Signed)
 Recent TSH mildly elevated.  Reminded to take first thing in the morning on an empty stomach. Considering seeing [facility].  I expressed caution with this and some of their practices not being backed by sufficient evidence.  Recheck TSH in 3 months.

## 2023-08-10 NOTE — Patient Instructions (Addendum)
 Black cumin B-complex  Selenium  [facility]

## 2023-08-10 NOTE — Assessment & Plan Note (Signed)
 BP elevated today.  Return in 2 weeks for BP recheck.

## 2023-08-23 ENCOUNTER — Other Ambulatory Visit: Payer: Self-pay | Admitting: Family Medicine

## 2023-08-23 DIAGNOSIS — E039 Hypothyroidism, unspecified: Secondary | ICD-10-CM

## 2023-08-24 ENCOUNTER — Ambulatory Visit (INDEPENDENT_AMBULATORY_CARE_PROVIDER_SITE_OTHER)

## 2023-08-24 VITALS — BP 124/81 | HR 66 | Ht 71.5 in | Wt 258.0 lb

## 2023-08-24 DIAGNOSIS — I10 Essential (primary) hypertension: Secondary | ICD-10-CM | POA: Diagnosis not present

## 2023-08-24 NOTE — Progress Notes (Unsigned)
 Patient is here for blood pressure check.   Previous BP was 150/99  1st BP today: 124/81  Denies chest pain, dizziness, shortness of breath, severe headache, or nosebleeds.  Taking medication as prescribed. Missed 1 dose. States home BP's have been doing well.   Return in 6 months.

## 2024-02-04 ENCOUNTER — Other Ambulatory Visit: Payer: Self-pay | Admitting: Family Medicine

## 2024-02-04 DIAGNOSIS — I10 Essential (primary) hypertension: Secondary | ICD-10-CM

## 2024-02-04 NOTE — Telephone Encounter (Signed)
 Pls contact pt to schedule HTN appt with Dr. Alvia. Due in Nov. Sending 90 day med refill. Thx
